# Patient Record
Sex: Female | Born: 1990 | Race: Black or African American | Hispanic: No | Marital: Single | Smoking: Never smoker
Health system: Southern US, Community
[De-identification: ages and names within clinical notes are randomized; demographics above are authoritative.]

## PROBLEM LIST (undated history)

## (undated) DIAGNOSIS — G43909 Migraine, unspecified, not intractable, without status migrainosus: Secondary | ICD-10-CM

## (undated) DIAGNOSIS — F419 Anxiety disorder, unspecified: Secondary | ICD-10-CM

## (undated) HISTORY — PX: NO PAST SURGERIES: SHX2092

---
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
## 2009-02-08 ENCOUNTER — Emergency Department (HOSPITAL_COMMUNITY): Admission: EM | Admit: 2009-02-08 | Discharge: 2009-02-08 | Payer: Self-pay | Admitting: Family Medicine

## 2009-02-12 ENCOUNTER — Emergency Department (HOSPITAL_COMMUNITY): Admission: EM | Admit: 2009-02-12 | Discharge: 2009-02-12 | Payer: Self-pay | Admitting: Family Medicine

## 2009-09-23 ENCOUNTER — Emergency Department (HOSPITAL_COMMUNITY): Admission: EM | Admit: 2009-09-23 | Discharge: 2009-09-23 | Payer: Self-pay | Admitting: Emergency Medicine

## 2009-10-25 ENCOUNTER — Emergency Department (HOSPITAL_COMMUNITY): Admission: EM | Admit: 2009-10-25 | Discharge: 2009-10-25 | Payer: Self-pay | Admitting: Emergency Medicine

## 2009-10-27 ENCOUNTER — Inpatient Hospital Stay (HOSPITAL_COMMUNITY): Admission: AD | Admit: 2009-10-27 | Discharge: 2009-10-27 | Payer: Self-pay | Admitting: Obstetrics and Gynecology

## 2009-10-27 ENCOUNTER — Emergency Department (HOSPITAL_COMMUNITY): Admission: EM | Admit: 2009-10-27 | Discharge: 2009-10-27 | Payer: Self-pay | Admitting: Family Medicine

## 2009-12-22 ENCOUNTER — Emergency Department (HOSPITAL_COMMUNITY): Admission: EM | Admit: 2009-12-22 | Discharge: 2009-12-22 | Payer: Self-pay | Admitting: Family Medicine

## 2010-01-12 ENCOUNTER — Emergency Department (HOSPITAL_COMMUNITY): Admission: EM | Admit: 2010-01-12 | Discharge: 2010-01-12 | Payer: Self-pay | Admitting: Emergency Medicine

## 2010-04-24 ENCOUNTER — Emergency Department (HOSPITAL_COMMUNITY): Admission: EM | Admit: 2010-04-24 | Discharge: 2010-04-24 | Payer: Self-pay | Admitting: Emergency Medicine

## 2010-04-26 ENCOUNTER — Emergency Department (HOSPITAL_COMMUNITY): Admission: EM | Admit: 2010-04-26 | Discharge: 2010-04-26 | Payer: Self-pay | Admitting: Family Medicine

## 2010-09-18 ENCOUNTER — Emergency Department (HOSPITAL_COMMUNITY): Admission: EM | Admit: 2010-09-18 | Discharge: 2010-09-18 | Payer: Self-pay | Admitting: Emergency Medicine

## 2010-10-18 ENCOUNTER — Emergency Department (HOSPITAL_COMMUNITY)
Admission: EM | Admit: 2010-10-18 | Discharge: 2010-10-18 | Payer: Self-pay | Source: Home / Self Care | Admitting: Emergency Medicine

## 2011-01-23 LAB — POCT URINALYSIS DIPSTICK
Glucose, UA: NEGATIVE mg/dL
Hgb urine dipstick: NEGATIVE
Ketones, ur: 15 mg/dL — AB
Nitrite: POSITIVE — AB
Protein, ur: 100 mg/dL — AB
Specific Gravity, Urine: 1.02 (ref 1.005–1.030)
Urobilinogen, UA: 1 mg/dL (ref 0.0–1.0)
pH: 6.5 (ref 5.0–8.0)

## 2011-01-23 LAB — URINE CULTURE
Colony Count: 9000
Culture  Setup Time: 201112062210

## 2011-01-23 LAB — POCT RAPID STREP A (OFFICE): Streptococcus, Group A Screen (Direct): POSITIVE — AB

## 2011-01-24 LAB — POCT I-STAT, CHEM 8
BUN: 6 mg/dL (ref 6–23)
Calcium, Ion: 1.13 mmol/L (ref 1.12–1.32)
Chloride: 108 mEq/L (ref 96–112)
Creatinine, Ser: 0.9 mg/dL (ref 0.4–1.2)
Glucose, Bld: 74 mg/dL (ref 70–99)
HCT: 43 % (ref 36.0–46.0)
Hemoglobin: 14.6 g/dL (ref 12.0–15.0)
Potassium: 4.2 mEq/L (ref 3.5–5.1)
Sodium: 143 mEq/L (ref 135–145)
TCO2: 25 mmol/L (ref 0–100)

## 2011-01-24 LAB — URINALYSIS, ROUTINE W REFLEX MICROSCOPIC
Bilirubin Urine: NEGATIVE
Glucose, UA: NEGATIVE mg/dL
Hgb urine dipstick: NEGATIVE
Ketones, ur: NEGATIVE mg/dL
Nitrite: NEGATIVE
Protein, ur: 100 mg/dL — AB
Specific Gravity, Urine: 1.025 (ref 1.005–1.030)
Urobilinogen, UA: 1 mg/dL (ref 0.0–1.0)
pH: 8 (ref 5.0–8.0)

## 2011-01-24 LAB — URINE MICROSCOPIC-ADD ON

## 2011-01-24 LAB — PREGNANCY, URINE: Preg Test, Ur: NEGATIVE

## 2011-01-24 LAB — GLUCOSE, CAPILLARY: Glucose-Capillary: 101 mg/dL — ABNORMAL HIGH (ref 70–99)

## 2011-02-01 ENCOUNTER — Emergency Department (HOSPITAL_COMMUNITY)
Admission: EM | Admit: 2011-02-01 | Discharge: 2011-02-01 | Disposition: A | Discharge status: Home / Self Care | Payer: Medicaid Other | Attending: Emergency Medicine | Admitting: Emergency Medicine

## 2011-02-01 DIAGNOSIS — M545 Low back pain, unspecified: Secondary | ICD-10-CM | POA: Insufficient documentation

## 2011-02-14 LAB — CBC
HCT: 37.9 % (ref 36.0–46.0)
MCV: 95.8 fL (ref 78.0–100.0)
Platelets: 218 10*3/uL (ref 150–400)
WBC: 6 10*3/uL (ref 4.0–10.5)

## 2011-02-14 LAB — POCT I-STAT, CHEM 8
BUN: 6 mg/dL (ref 6–23)
Calcium, Ion: 1.21 mmol/L (ref 1.12–1.32)
Chloride: 105 mEq/L (ref 96–112)
Glucose, Bld: 92 mg/dL (ref 70–99)

## 2011-02-14 LAB — GC/CHLAMYDIA PROBE AMP, GENITAL
Chlamydia, DNA Probe: NEGATIVE
GC Probe Amp, Genital: NEGATIVE

## 2011-02-22 LAB — HIV ANTIBODY (ROUTINE TESTING W REFLEX): HIV: NONREACTIVE

## 2011-03-28 NOTE — Discharge Summary (Signed)
Nicole Olson, [place]             ACCOUNT NO.:  [id number]   MEDICAL RECORD NO.:  [id number]          PATIENT TYPE:  INP   LOCATION:  9115                          FACILITY:  [facility]   PHYSICIAN:  James A. Ashley Royalty, M.D.DATE OF BIRTH:  08/02/1990   DATE OF ADMISSION:  06/28/2007  DATE OF DISCHARGE:  06/30/2007                               DISCHARGE SUMMARY   DISCHARGE DIAGNOSES:  1. Intrauterine pregnancy at 39 weeks 1 day gestation.  2. Rh negative.  3. Term birth of living child, vertex  4. Anemia - asymptomatic   OPERATIONS AND SPECIAL PROCEDURES:  OB delivery.   CONSULTATIONS:  None.   DISCHARGE MEDICATIONS:  Motrin 60 mg, Percocet, vitamins.   HISTORY AND PHYSICAL:  21 year old primigravida at 64 weeks 1 day  gestation.  Prenatal care was complicated by A negative blood type and  diminished abdominal circumference on ultrasound.  No frank IUGR was  noted.  The patient presented complaining of regular contractions.  Cervix on my examination was noted be 3+ cm dilated, 80% effaced, -1 to  -2 two station, vertex presentation.  For the remainder of the history  and physical, please see the chart.   HOSPITAL COURSE:  The patient was admitted to [facility].  Admission laboratory studies were drawn.  She was given  Pitocin.  She went on to labor and deliver on June 28, 2007.  The  infant was a 6 pound 3 ounce female with Apgars of 9 at 1 minute and 9 at  5 minutes.  The patient was sent to the newborn nursery.  Delivery was  accomplished by Dr. Sylvester Harder over an intact perineum.  There was a  first-degree laceration at the introitus which was easily closed.  The  patient's postpartum course was benign, save for the asymptomatic  anemia.  She was discharged on the second postpartum day afebrile and in  satisfactory condition.   DISPOSITION:  The patient is to return to [facility] and  Obstetrics in 6 weeks for postpartum evaluation.      James A. Ashley Royalty, M.D.  Electronically Signed     JAM/MEDQ  D:  08/01/2007  T:  08/01/2007  Job:  [id number]

## 2011-08-18 LAB — URINALYSIS, ROUTINE W REFLEX MICROSCOPIC
Bilirubin Urine: NEGATIVE
Glucose, UA: NEGATIVE mg/dL
Hgb urine dipstick: NEGATIVE
Ketones, ur: NEGATIVE mg/dL
Nitrite: NEGATIVE
Protein, ur: NEGATIVE mg/dL
Specific Gravity, Urine: 1.024 (ref 1.005–1.030)
Urobilinogen, UA: 1 mg/dL (ref 0.0–1.0)
pH: 6 (ref 5.0–8.0)

## 2011-08-18 LAB — URINE MICROSCOPIC-ADD ON

## 2011-08-18 LAB — URINE CULTURE: Colony Count: 15000

## 2011-08-18 LAB — PREGNANCY, URINE: Preg Test, Ur: NEGATIVE

## 2011-08-25 LAB — URINALYSIS, ROUTINE W REFLEX MICROSCOPIC
Glucose, UA: NEGATIVE
Ketones, ur: NEGATIVE
Nitrite: NEGATIVE
Protein, ur: NEGATIVE
pH: 6.5

## 2011-08-25 LAB — COMPREHENSIVE METABOLIC PANEL
ALT: 45 — ABNORMAL HIGH
AST: 47 — ABNORMAL HIGH
Albumin: 2.4 — ABNORMAL LOW
Alkaline Phosphatase: 94
BUN: 2 — ABNORMAL LOW
Chloride: 110
Potassium: 4.3
Sodium: 141
Total Bilirubin: 0.7
Total Protein: 5.9 — ABNORMAL LOW

## 2011-08-25 LAB — CBC
HCT: 28.9 — ABNORMAL LOW
HCT: 31.4 — ABNORMAL LOW
Hemoglobin: 11.1 — ABNORMAL LOW
Hemoglobin: 9.9 — ABNORMAL LOW
MCV: 97.1
Platelets: 134 — ABNORMAL LOW
Platelets: 205
RBC: 2.75 — ABNORMAL LOW
RBC: 2.98 — ABNORMAL LOW
RBC: 3.37 — ABNORMAL LOW
RDW: 13.6
WBC: 10.1 — ABNORMAL HIGH
WBC: 11.4 — ABNORMAL HIGH
WBC: 18.4 — ABNORMAL HIGH
WBC: 8.4

## 2011-08-25 LAB — RH IMMUNE GLOB WKUP(>/=20WKS)(NOT WOMEN'S HOSP)

## 2011-08-25 LAB — RPR: RPR Ser Ql: NONREACTIVE

## 2011-08-28 LAB — URINALYSIS, ROUTINE W REFLEX MICROSCOPIC
Bilirubin Urine: NEGATIVE
Glucose, UA: NEGATIVE
Ketones, ur: NEGATIVE
Leukocytes, UA: NEGATIVE
Nitrite: NEGATIVE
Protein, ur: NEGATIVE
Specific Gravity, Urine: 1.02
Urobilinogen, UA: 2 — ABNORMAL HIGH
pH: 8

## 2011-08-28 LAB — URINE MICROSCOPIC-ADD ON

## 2011-08-30 LAB — URINALYSIS, ROUTINE W REFLEX MICROSCOPIC
Glucose, UA: NEGATIVE
Ketones, ur: NEGATIVE
Protein, ur: NEGATIVE
Urobilinogen, UA: 1

## 2011-08-30 LAB — WET PREP, GENITAL: Trich, Wet Prep: NONE SEEN

## 2012-04-21 ENCOUNTER — Emergency Department (HOSPITAL_COMMUNITY)
Admission: EM | Admit: 2012-04-21 | Discharge: 2012-04-22 | Disposition: A | Discharge status: Home / Self Care | Payer: Medicaid Other | Attending: Emergency Medicine | Admitting: Emergency Medicine

## 2012-04-21 ENCOUNTER — Encounter (HOSPITAL_COMMUNITY): Payer: Self-pay | Admitting: *Deleted

## 2012-04-21 DIAGNOSIS — R51 Headache: Secondary | ICD-10-CM | POA: Insufficient documentation

## 2012-04-21 MED ORDER — METOCLOPRAMIDE HCL 5 MG/ML IJ SOLN
10.0000 mg | Freq: Once | INTRAMUSCULAR | Status: AC
Start: 2012-04-21 — End: 2012-04-21
  Administered 2012-04-21: 10 mg via INTRAVENOUS
  Filled 2012-04-21: qty 2

## 2012-04-21 MED ORDER — DIPHENHYDRAMINE HCL 50 MG/ML IJ SOLN
12.5000 mg | Freq: Once | INTRAMUSCULAR | Status: AC
  Administered 2012-04-21: 12.5 mg via INTRAVENOUS
  Filled 2012-04-21: qty 1

## 2012-04-21 MED ORDER — KETOROLAC TROMETHAMINE 30 MG/ML IJ SOLN
30.0000 mg | Freq: Once | INTRAMUSCULAR | Status: AC
  Administered 2012-04-21: 30 mg via INTRAVENOUS
  Filled 2012-04-21: qty 1

## 2012-04-21 MED ORDER — SODIUM CHLORIDE 0.9 % IV BOLUS (SEPSIS)
1000.0000 mL | Freq: Once | INTRAVENOUS | Status: AC
  Administered 2012-04-21: 1000 mL via INTRAVENOUS

## 2012-04-21 NOTE — ED Provider Notes (Signed)
History     CSN: [id number]  Arrival date & time 04/21/12  2143   First MD Initiated Contact with Patient 04/21/12 2253      Chief Complaint  Patient presents with  . Headache    (Consider location/radiation/quality/duration/timing/severity/associated sxs/prior treatment) HPI Comments: Patient has had a headache since yesterday, left frontal headache that has been persistent despite the fact that she has taken over-the-counter ibuprofen  Patient is a 21 y.o. female presenting with headaches. The history is provided by the patient.  Headache  This is a new problem. The current episode started 12 to 24 hours ago. The problem occurs constantly. The problem has not changed since onset.The headache is associated with bright light. The quality of the pain is described as throbbing. The pain is at a severity of 8/10. The pain is moderate. The pain does not radiate. Associated symptoms include nausea. Pertinent negatives include no fever and no vomiting.    History reviewed. No pertinent past medical history.  History reviewed. No pertinent past surgical history.  History reviewed. No pertinent family history.  History  Substance Use Topics  . Smoking status: Never Smoker   . Smokeless tobacco: Not on file  . Alcohol Use: Yes    OB History    Grav Para Term Preterm Abortions TAB SAB Ect Mult Living                  Review of Systems  Constitutional: Negative for fever and chills.  Gastrointestinal: Positive for nausea. Negative for vomiting.  Neurological: Positive for headaches. Negative for dizziness, weakness and numbness.    Allergies  Review of patient's allergies indicates no known allergies.  Home Medications   Current Outpatient Rx  Name Route Sig Dispense Refill  . IBUPROFEN 200 MG PO TABS Oral Take 400 mg by mouth once as needed. As needed for headache.      BP 120/84  Pulse 83  Temp(Src) 98.2 F (36.8 C) (Oral)  Resp 18  SpO2 98%  Physical Exam    Constitutional: She is oriented to person, place, and time. She appears well-developed and well-nourished.  HENT:  Head: Normocephalic.  Right Ear: External ear normal.  Left Ear: External ear normal.  Eyes: Pupils are equal, round, and reactive to light.  Neck: Normal range of motion.  Cardiovascular: Normal rate.   Musculoskeletal: Normal range of motion.  Neurological: She is alert and oriented to person, place, and time.  Skin: Skin is warm and dry.    ED Course  Procedures (including critical care time)  Labs Reviewed - No data to display No results found.   1. Headache       MDM   Headache that improved after IV fluid, Reglan, Toradol, and Benadryl.  Will discharge home        Arman Filter, NP 04/21/12 2320  Arman Filter, NP 04/21/12 2320

## 2012-04-21 NOTE — ED Notes (Signed)
patietn with history of headache and today her head started hurting this am.  Patient took advil but she was not able to hold it down.

## 2012-04-21 NOTE — ED Notes (Signed)
Here for frontal HA associated with photophobia, N/V.

## 2012-04-21 NOTE — Discharge Instructions (Signed)
General Headache, Without Cause A general headache has no specific cause. These headaches are not life-threatening. They will not lead to other types of headaches. HOME CARE   Make and keep follow-up visits with your doctor.   Only take medicine as told by your doctor.   Try to relax, get a massage, or use your thoughts to control your body (biofeedback).   Apply cold or heat to the head and neck. Apply 3 or 4 times a day or as needed.  Finding out the results of your test Ask when your test results will be ready. Make sure you get your test results. GET HELP RIGHT AWAY IF:   You have problems with medicine.   Your medicine does not help relieve pain.   Your headache changes or becomes worse.   You feel sick to your stomach (nauseous) or throw up (vomit).   You have a temperature by mouth above 102 F (38.9 C), not controlled by medicine.   Your have a stiff neck.   You have vision loss.   You have muscle weakness.   You lose control of your muscles.   You lose balance or have trouble walking.   You feel like you are going to pass out (faint).  MAKE SURE YOU:   Understand these instructions.   Will watch this condition.   Will get help right away if you are not doing well or get worse.  Document Released: 08/08/2008 Document Revised: 10/19/2011 Document Reviewed: 08/08/2008 Exit[employer] Patient Information 2012 [employer]. 

## 2012-04-22 NOTE — ED Notes (Signed)
Patient given discharge paperwork; went over discharge instructions with patient.  Patient instructed to follow up with primary care physician if symptoms persist, and to return to the ED for new, worsening, or concerning symptoms.

## 2012-04-23 ENCOUNTER — Emergency Department (INDEPENDENT_AMBULATORY_CARE_PROVIDER_SITE_OTHER)
Admission: EM | Admit: 2012-04-23 | Discharge: 2012-04-23 | Disposition: A | Discharge status: Home / Self Care | Payer: Self-pay | Source: Home / Self Care | Attending: Emergency Medicine | Admitting: Emergency Medicine

## 2012-04-23 ENCOUNTER — Encounter (HOSPITAL_COMMUNITY): Payer: Self-pay | Admitting: Emergency Medicine

## 2012-04-23 DIAGNOSIS — G43909 Migraine, unspecified, not intractable, without status migrainosus: Secondary | ICD-10-CM

## 2012-04-23 MED ORDER — DIPHENHYDRAMINE HCL 50 MG/ML IJ SOLN
INTRAMUSCULAR | Status: AC
  Filled 2012-04-23: qty 1

## 2012-04-23 MED ORDER — KETOROLAC TROMETHAMINE 60 MG/2ML IM SOLN
INTRAMUSCULAR | Status: AC
  Filled 2012-04-23: qty 2

## 2012-04-23 MED ORDER — KETOROLAC TROMETHAMINE 60 MG/2ML IM SOLN
60.0000 mg | Freq: Once | INTRAMUSCULAR | Status: AC
  Administered 2012-04-23: 60 mg via INTRAMUSCULAR

## 2012-04-23 MED ORDER — DIPHENHYDRAMINE HCL 50 MG/ML IJ SOLN
25.0000 mg | Freq: Once | INTRAMUSCULAR | Status: AC
  Administered 2012-04-23: 25 mg via INTRAMUSCULAR

## 2012-04-23 MED ORDER — PREDNISONE 5 MG PO KIT: 1.0000 | PACK | Freq: Every day | ORAL | Status: DC

## 2012-04-23 MED ORDER — DEXAMETHASONE SODIUM PHOSPHATE 10 MG/ML IJ SOLN
INTRAMUSCULAR | Status: AC
  Filled 2012-04-23: qty 2

## 2012-04-23 MED ORDER — DEXAMETHASONE SODIUM PHOSPHATE 10 MG/ML IJ SOLN
20.0000 mg | Freq: Once | INTRAMUSCULAR | Status: AC
  Administered 2012-04-23: 20 mg via INTRAMUSCULAR

## 2012-04-23 MED ORDER — ONDANSETRON 8 MG PO TBDP: 8.0000 mg | ORAL_TABLET | Freq: Three times a day (TID) | ORAL | Status: AC | PRN

## 2012-04-23 MED ORDER — SUMATRIPTAN SUCCINATE 50 MG PO TABS: 50.0000 mg | ORAL_TABLET | ORAL | Status: DC | PRN

## 2012-04-23 NOTE — ED Provider Notes (Signed)
Medical screening examination/treatment/procedure(s) were performed by non-physician practitioner and as supervising physician I was immediately available for consultation/collaboration.   Carleene Cooper III, MD 04/23/12 1655

## 2012-04-23 NOTE — ED Provider Notes (Signed)
Chief Complaint  Patient presents with  . Headache    History of Present Illness:   The patient has a two-week history of migraine type headache. This was possibly brought on by loud noise at work. The pain is located in the right hemicranium, is throbbing, and is rated 8/10 in intensity. It's associated with nausea and vomiting. She has photophobia and phonophobia and the pain is worse with activity and better with rest. She has a visual scotoma involving the right visual field. She denies any numbness, tingling, weakness, difficulty with speech or ambulation. She denies fevers, chills, sweats, sore throat, or stiff neck. The patient has a five-year history of migraines although these are infrequent. She had one that was similar to this or at the birth of her baby 5 years ago. She's had some minor headaches since then and this is the second severe migraine that she's had. She's been on Depakote Provera for contraception. She was at the emergency department 2 days ago for the same headache and was given IV Toradol, Benadryl, and Reglan. The headache went away for a little while but then came back again.  Review of Systems:  Other than noted above, the patient denies any of the following symptoms: Systemic:  No fever, chills, fatigue, photophobia, stiff neck. Eye:  No redness, eye pain, discharge, blurred vision, or diplopia. ENT:  No nasal congestion, rhinorrhea, sinus pressure or pain, sneezing, earache, or sore throat.  No jaw claudication. Neuro:  No paresthesias, loss of consciousness, seizure activity, muscle weakness, trouble with coordination or gait, trouble speaking or swallowing. Psych:  No depression, anxiety or trouble sleeping.  PMFSH:  Past medical history, family history, social history, meds, and allergies were reviewed.  Physical Exam:   Vital signs:  BP 107/67  Pulse 52  Temp(Src) 98.3 F (36.8 C) (Oral)  Resp 16  SpO2 100% General:  Alert and oriented. She appears  uncomfortable and is lying in a darkened room she'll rise from a little bit of light that was coming from the computer screen. Eye:  Lids and conjunctivas normal.  PERRL,  Full EOMs.  Fundi benign with normal discs and vessels. ENT:  No cranial or facial tenderness to palpation.  TMs and canals clear.  Nasal mucosa was normal and uncongested without any drainage. No intra oral lesions, pharynx clear, mucous membranes moist, dentition normal. Neck:  Supple, full ROM, no tenderness to palpation.  No adenopathy or mass. Neuro:  Alert and orented times 3.  Speech was clear, fluent, and appropriate.  Cranial nerves intact. No pronator drift, muscle strength normal. Finger to nose normal.  DTRs were 2+ and symmetrical.Station and gait were normal.  Romberg's sign was normal.  Able to perform tandem gait well. Psych:  Normal affect.  Medications given in [facility]:  She was given Toradol 60 mg IM, Decadron 10 mg IM, and Benadryl 25 mg IM. She tolerated these all well without any immediate side effects.  Assessment:  The encounter diagnosis was Migraine headache. With status migrainosus.  Plan:   1.  The following meds were prescribed:   New Prescriptions   ONDANSETRON (ZOFRAN ODT) 8 MG DISINTEGRATING TABLET    Take 1 tablet (8 mg total) by mouth every 8 (eight) hours as needed for nausea.   PREDNISONE 5 MG KIT    Take 1 kit (5 mg total) by mouth daily after breakfast. Prednisone 5 mg 6 day dosepack.  Take as directed.   SUMATRIPTAN (IMITREX) 50 MG TABLET  Take 1 tablet (50 mg total) by mouth every 2 (two) hours as needed for migraine.   2.  The patient was instructed in symptomatic care and handouts were given. 3.  The patient was told to return if becoming worse in any way, if no better in 3 or 4 days, and given some red flag symptoms that would indicate earlier return. She was urged to followup with her primary care physician as soon as possible for ongoing migraine treatment.    Reuben Likes,  MD 04/23/12 [phone]

## 2012-04-23 NOTE — Discharge Instructions (Signed)

## 2012-04-23 NOTE — ED Notes (Signed)
C/o HA for past 2-3 days, no improvement w her medications

## 2012-05-18 ENCOUNTER — Encounter (HOSPITAL_COMMUNITY): Payer: Self-pay | Admitting: *Deleted

## 2012-05-18 ENCOUNTER — Emergency Department (HOSPITAL_COMMUNITY)
Admission: EM | Admit: 2012-05-18 | Discharge: 2012-05-18 | Disposition: A | Discharge status: Home / Self Care | Payer: Medicaid Other | Attending: Emergency Medicine | Admitting: Emergency Medicine

## 2012-05-18 DIAGNOSIS — H1012 Acute atopic conjunctivitis, left eye: Secondary | ICD-10-CM

## 2012-05-18 DIAGNOSIS — H1045 Other chronic allergic conjunctivitis: Secondary | ICD-10-CM | POA: Insufficient documentation

## 2012-05-18 NOTE — ED Notes (Signed)
Pt states she began with itching of her left eye. It became red and swollen. Pain is 1/10.  Pt used visene eye drops but it did not help. No fever, no n/v/d. No pain meds taken

## 2012-05-18 NOTE — ED Provider Notes (Signed)
History     CSN: [id number]  Arrival date & time 05/18/12  0040   First MD Initiated Contact with Patient 05/18/12 0127      Chief Complaint  Patient presents with  . Conjunctivitis    (Consider location/radiation/quality/duration/timing/severity/associated sxs/prior treatment) Patient is a 21 y.o. female presenting with conjunctivitis. The history is provided by the patient.  Conjunctivitis  The current episode started today. The problem occurs rarely. The problem has been unchanged. The problem is mild. Associated symptoms include eye itching, eye discharge and eye redness. Pertinent negatives include no fever, no decreased vision, no double vision, no photophobia, no abdominal pain, no diarrhea, no headaches, no rhinorrhea, no muscle aches, no neck pain, no neck stiffness, no cough, no URI, no rash and no eye pain. There is pain in the left eye. There were no sick contacts. She has received no recent medical care.    History reviewed. No pertinent past medical history.  History reviewed. No pertinent past surgical history.  History reviewed. No pertinent family history.  History  Substance Use Topics  . Smoking status: Never Smoker   . Smokeless tobacco: Not on file  . Alcohol Use: Yes    OB History    Grav Para Term Preterm Abortions TAB SAB Ect Mult Living                  Review of Systems  Constitutional: Negative for fever.  HENT: Negative for rhinorrhea and neck pain.   Eyes: Positive for discharge, redness and itching. Negative for double vision, photophobia and pain.  Respiratory: Negative for cough.   Gastrointestinal: Negative for abdominal pain and diarrhea.  Skin: Negative for rash.  Neurological: Negative for headaches.  All other systems reviewed and are negative.    Allergies  Review of patient's allergies indicates no known allergies.  Home Medications   Current Outpatient Rx  Name Route Sig Dispense Refill  . IBUPROFEN 200 MG PO TABS Oral  Take 400 mg by mouth once as needed. As needed for headache.    . SUMATRIPTAN SUCCINATE 50 MG PO TABS Oral Take 1 tablet (50 mg total) by mouth every 2 (two) hours as needed for migraine. 10 tablet 0    BP 106/69  Pulse 76  Temp 98.4 F (36.9 C) (Oral)  Resp 17  SpO2 100%  Physical Exam  Constitutional: She appears well-developed and well-nourished.  Eyes: Pupils are equal, round, and reactive to light. Right eye exhibits no hordeolum. Left eye exhibits chemosis and discharge. Left eye exhibits no exudate and no hordeolum. Right conjunctiva is not injected. Left conjunctiva is injected. Left conjunctiva has no hemorrhage. No scleral icterus. Right eye exhibits normal extraocular motion.  Cardiovascular: Normal rate.     ED Course  Procedures (including critical care time)  Labs Reviewed - No data to display No results found.   1. Allergic conjunctivitis of left eye       MDM  Consistent with seasonal allergies and at this time no concerns of conjunctivitis or periorbital cellulitis.          Edwardine Deschepper C. Brecken Walth, DO 05/18/12 0240

## 2012-09-15 ENCOUNTER — Emergency Department (HOSPITAL_COMMUNITY)
Admission: EM | Admit: 2012-09-15 | Discharge: 2012-09-15 | Disposition: A | Discharge status: Home / Self Care | Payer: Medicaid Other | Attending: Emergency Medicine | Admitting: Emergency Medicine

## 2012-09-15 ENCOUNTER — Encounter (HOSPITAL_COMMUNITY): Payer: Self-pay | Admitting: Emergency Medicine

## 2012-09-15 DIAGNOSIS — R110 Nausea: Secondary | ICD-10-CM | POA: Insufficient documentation

## 2012-09-15 DIAGNOSIS — G43909 Migraine, unspecified, not intractable, without status migrainosus: Secondary | ICD-10-CM | POA: Insufficient documentation

## 2012-09-15 HISTORY — DX: Migraine, unspecified, not intractable, without status migrainosus: G43.909

## 2012-09-15 MED ORDER — SODIUM CHLORIDE 0.9 % IV BOLUS (SEPSIS)
1000.0000 mL | Freq: Once | INTRAVENOUS | Status: AC
  Administered 2012-09-15: 1000 mL via INTRAVENOUS

## 2012-09-15 MED ORDER — IBUPROFEN 600 MG PO TABS: 600.0000 mg | ORAL_TABLET | Freq: Four times a day (QID) | ORAL | Status: DC | PRN

## 2012-09-15 MED ORDER — KETOROLAC TROMETHAMINE 30 MG/ML IJ SOLN
30.0000 mg | Freq: Once | INTRAMUSCULAR | Status: AC
  Administered 2012-09-15: 30 mg via INTRAVENOUS
  Filled 2012-09-15: qty 1

## 2012-09-15 MED ORDER — METOCLOPRAMIDE HCL 5 MG/ML IJ SOLN
10.0000 mg | Freq: Once | INTRAMUSCULAR | Status: AC
  Administered 2012-09-15: 10 mg via INTRAVENOUS
  Filled 2012-09-15: qty 2

## 2012-09-15 NOTE — ED Notes (Signed)
Pt A.O. X 4. NAD.  Respirations even and regular. Skin warm, dry, and intact. Vitals stable. Gait stable with no assist. Reports migraine 1/10. Denies N/V. Verbalized understanding of medication admin. Able to locate contact information for follow up with neurologist. No further questions at this time.

## 2012-09-15 NOTE — ED Notes (Signed)
Pt reports having migraine since earlier today, took tylenol around 4pm; reports light sensitivity, n/v, and some blurred vision; pt reports hx of migraines

## 2012-09-15 NOTE — ED Provider Notes (Addendum)
History     CSN: [id number]  Arrival date & time 09/15/12  2105   First MD Initiated Contact with Patient 09/15/12 2123      Chief Complaint  Patient presents with  . Migraine    (Consider location/radiation/quality/duration/timing/severity/associated sxs/prior treatment) HPI Comments: Pt comes in with cc of migraines. Pt has been told in the past she has migraines, and she comes to the ED with similar headaches in the past. The headache is throbbing, left sided, and there is associated nausea, photophobia. No meds taken at home. No visual complains, seizures, altered mental status, loss of consciousness, new weakness, or numbness, no gait instability.  Patient is a 21 y.o. female presenting with migraines. The history is provided by the patient.  Migraine Associated symptoms include headaches. Pertinent negatives include no chest pain, no abdominal pain and no shortness of breath.    Past Medical History  Diagnosis Date  . Migraine     History reviewed. No pertinent past surgical history.  History reviewed. No pertinent family history.  History  Substance Use Topics  . Smoking status: Never Smoker   . Smokeless tobacco: Not on file  . Alcohol Use: No    OB History    Grav Para Term Preterm Abortions TAB SAB Ect Mult Living                  Review of Systems  Constitutional: Negative for activity change.  HENT: Negative for facial swelling and neck pain.   Respiratory: Negative for cough, shortness of breath and wheezing.   Cardiovascular: Negative for chest pain.  Gastrointestinal: Positive for nausea. Negative for vomiting, abdominal pain, diarrhea, constipation, blood in stool and abdominal distention.  Genitourinary: Negative for hematuria and difficulty urinating.  Skin: Negative for color change.  Neurological: Positive for headaches. Negative for speech difficulty.  Hematological: Does not bruise/bleed easily.  Psychiatric/Behavioral: Negative for  confusion.    Allergies  Review of patient's allergies indicates no known allergies.  Home Medications  No current outpatient prescriptions on file.  BP 114/69  Pulse 73  Temp 98.8 F (37.1 C) (Oral)  Resp 26  SpO2 100%  Physical Exam  Nursing note and vitals reviewed. Constitutional: She is oriented to person, place, and time. She appears well-developed.  HENT:  Head: Normocephalic and atraumatic.  Eyes: Conjunctivae normal and EOM are normal. Pupils are equal, round, and reactive to light.  Neck: Normal range of motion. Neck supple.  Cardiovascular: Normal rate, regular rhythm and normal heart sounds.   Pulmonary/Chest: Effort normal and breath sounds normal. No respiratory distress.  Abdominal: Soft. Bowel sounds are normal. She exhibits no distension. There is no tenderness. There is no rebound and no guarding.  Neurological: She is alert and oriented to person, place, and time. No cranial nerve deficit. Coordination normal.       Cerebellar exam is normal (finger to nose) Sensory exam normal for bilateral upper and lower extremities - and patient is able to discriminate between sharp and dull. Motor exam is 4+/5  Skin: Skin is warm and dry.    ED Course  Procedures (including critical care time)   Labs Reviewed  PREGNANCY, URINE   No results found.   No diagnosis found.    MDM  DDX includes: Primary headaches - including migrainous headaches, cluster headaches, tension headaches. ICH Carotid dissection Cavernous sinus thrombosis Meningitis Encephalitis Sinusitis Tumor Vascular headaches AV malformation Brain aneurysm Muscular headaches  A/P: Pt comes in with cc of headaches. No  concerns for life threatening secondary headaches because she has had similar headaches in the  past and it is not the worst headache she has had. Neuro exam is WNL. We will try to abort the headache with some nsaid and reglan.  Derwood Kaplan, MD 09/15/12 1610  Derwood Kaplan, MD 09/15/12 2320 Pt is now headache free. Will discharge.  Derwood Kaplan, MD 09/15/12 2325

## 2012-09-19 ENCOUNTER — Encounter (HOSPITAL_COMMUNITY): Payer: Self-pay | Admitting: *Deleted

## 2012-09-19 ENCOUNTER — Emergency Department (HOSPITAL_COMMUNITY)
Admission: EM | Admit: 2012-09-19 | Discharge: 2012-09-20 | Disposition: A | Discharge status: Home / Self Care | Payer: Medicaid Other | Attending: Emergency Medicine | Admitting: Emergency Medicine

## 2012-09-19 DIAGNOSIS — B009 Herpesviral infection, unspecified: Secondary | ICD-10-CM | POA: Insufficient documentation

## 2012-09-19 DIAGNOSIS — Z3202 Encounter for pregnancy test, result negative: Secondary | ICD-10-CM | POA: Insufficient documentation

## 2012-09-19 DIAGNOSIS — B001 Herpesviral vesicular dermatitis: Secondary | ICD-10-CM

## 2012-09-19 DIAGNOSIS — Z8669 Personal history of other diseases of the nervous system and sense organs: Secondary | ICD-10-CM | POA: Insufficient documentation

## 2012-09-19 DIAGNOSIS — R1012 Left upper quadrant pain: Secondary | ICD-10-CM | POA: Insufficient documentation

## 2012-09-19 LAB — COMPREHENSIVE METABOLIC PANEL
Albumin: 3.9 g/dL (ref 3.5–5.2)
Alkaline Phosphatase: 56 U/L (ref 39–117)
BUN: 8 mg/dL (ref 6–23)
CO2: 26 mEq/L (ref 19–32)
Chloride: 105 mEq/L (ref 96–112)
Creatinine, Ser: 0.73 mg/dL (ref 0.50–1.10)
GFR calc non Af Amer: >90 mL/min (ref >90)
Potassium: 3.6 mEq/L (ref 3.5–5.1)
Total Bilirubin: 0.6 mg/dL (ref 0.3–1.2)

## 2012-09-19 LAB — CBC WITH DIFFERENTIAL/PLATELET
Basophils Relative: 1 % (ref 0–1)
HCT: 39.9 % (ref 36.0–46.0)
Hemoglobin: 13.1 g/dL (ref 12.0–15.0)
Lymphocytes Relative: 53 % — ABNORMAL HIGH (ref 12–46)
Lymphs Abs: 3 10*3/uL (ref 0.7–4.0)
MCHC: 32.8 g/dL (ref 30.0–36.0)
Monocytes Absolute: 0.4 10*3/uL (ref 0.1–1.0)
Monocytes Relative: 6 % (ref 3–12)
Neutro Abs: 1.9 10*3/uL (ref 1.7–7.7)
Neutrophils Relative %: 34 % — ABNORMAL LOW (ref 43–77)
RBC: 4.28 MIL/uL (ref 3.87–5.11)
WBC: 5.6 10*3/uL (ref 4.0–10.5)

## 2012-09-19 LAB — URINALYSIS, MICROSCOPIC ONLY
Bilirubin Urine: NEGATIVE
Glucose, UA: NEGATIVE mg/dL
Ketones, ur: 15 mg/dL — AB
Protein, ur: NEGATIVE mg/dL
pH: 7.5 (ref 5.0–8.0)

## 2012-09-19 LAB — GLUCOSE, CAPILLARY: Glucose-Capillary: 86 mg/dL (ref 70–99)

## 2012-09-19 LAB — POCT PREGNANCY, URINE: Preg Test, Ur: NEGATIVE

## 2012-09-19 LAB — LIPASE, BLOOD: Lipase: 30 U/L (ref 11–59)

## 2012-09-19 NOTE — ED Notes (Signed)
Pt c/o upper abominal pain, denies n/v/d, fevers.  Also wants to be seen for cold sores, states Abreva usually helps but no relief this time.

## 2012-09-19 NOTE — ED Provider Notes (Signed)
History     CSN: [id number]  Arrival date & time 09/19/12  2102   First MD Initiated Contact with Patient 09/19/12 2318      Chief Complaint  Patient presents with  . Abdominal Pain  . Mouth Lesions    (Consider location/radiation/quality/duration/timing/severity/associated sxs/prior treatment) HPI  Nicole Olson is a 21 y.o. female c/o LUQ abdominal pain x1 week worsening over the course of 2 days. Pain is rated at 8/10, not alleviated by naproxen. Denies fever, N/V, change in bowel or bladder habits, vaginal discharge, or rash. Pt is also having an outbreak of cold sores.   Past Medical History  Diagnosis Date  . Migraine     History reviewed. No pertinent past surgical history.  History reviewed. No pertinent family history.  History  Substance Use Topics  . Smoking status: Never Smoker   . Smokeless tobacco: Not on file  . Alcohol Use: No    OB History    Grav Para Term Preterm Abortions TAB SAB Ect Mult Living                  Review of Systems  Constitutional: Negative for fever.  Respiratory: Negative for shortness of breath.   Cardiovascular: Negative for chest pain.  Gastrointestinal: Positive for abdominal pain. Negative for nausea, vomiting and diarrhea.  Skin: Positive for rash.  All other systems reviewed and are negative.    Allergies  Review of patient's allergies indicates no known allergies.  Home Medications   Current Outpatient Rx  Name  Route  Sig  Dispense  Refill  . IBUPROFEN 600 MG PO TABS   Oral   Take 1 tablet (600 mg total) by mouth every 6 (six) hours as needed for pain.   30 tablet   0     BP 118/52  Pulse 81  Temp 98.7 F (37.1 C) (Oral)  Resp 18  SpO2 98%  Physical Exam  Nursing note and vitals reviewed. Constitutional: She is oriented to person, place, and time. She appears well-developed and well-nourished. No distress.  HENT:  Head: Normocephalic and atraumatic.  Mouth/Throat: Oropharynx is clear and  moist.  Eyes: Conjunctivae normal and EOM are normal. Pupils are equal, round, and reactive to light.  Neck: Normal range of motion.  Cardiovascular: Normal rate, regular rhythm and intact distal pulses.   Pulmonary/Chest: Effort normal and breath sounds normal. No stridor. No respiratory distress. She has no wheezes. She has no rales. She exhibits no tenderness.  Abdominal: Soft. Bowel sounds are normal. She exhibits no distension and no mass. There is tenderness. There is no rebound and no guarding.       Mild TTP of LUQ, no rebound or gaurding  Musculoskeletal: Normal range of motion.  Neurological: She is alert and oriented to person, place, and time.  Skin:       Cold sore to left and right upper lip   Psychiatric: She has a normal mood and affect.    ED Course  Procedures (including critical care time)  Labs Reviewed  CBC WITH DIFFERENTIAL - Abnormal; Notable for the following:    Neutrophils Relative 34 (*)     Lymphocytes Relative 53 (*)     Eosinophils Relative 6 (*)     All other components within normal limits  COMPREHENSIVE METABOLIC PANEL - Abnormal; Notable for the following:    Glucose, Bld 56 (*)     All other components within normal limits  URINALYSIS, MICROSCOPIC ONLY - Abnormal; Notable  for the following:    APPearance CLOUDY (*)     Ketones, ur 15 (*)     Leukocytes, UA TRACE (*)     Squamous Epithelial / LPF MANY (*)     All other components within normal limits  LIPASE, BLOOD  POCT PREGNANCY, URINE  GLUCOSE, CAPILLARY   No results found.   1. LUQ abdominal pain   2. Herpes labialis       MDM  Abdominal exam is benign and due to lack of associated symptoms and length of pain I doubt any acute process.     Pt verbalized understanding and agrees with care plan. Outpatient follow-up and return precautions given.    New Prescriptions   TRAMADOL (ULTRAM) 50 MG TABLET    Take 1 tablet (50 mg total) by mouth every 6 (six) hours as needed for pain.     VALACYCLOVIR (VALTREX) 1000 MG TABLET    Take 2 tablets (2,000 mg total) by mouth once.         Wynetta Emery, PA-C 09/20/12 [phone]

## 2012-09-20 MED ORDER — VALACYCLOVIR HCL 1 G PO TABS: 2000.0000 mg | ORAL_TABLET | Freq: Once | ORAL | Status: DC

## 2012-09-20 MED ORDER — ACETAMINOPHEN 325 MG PO TABS
975.0000 mg | ORAL_TABLET | Freq: Once | ORAL | Status: AC
  Administered 2012-09-20: 975 mg via ORAL
  Filled 2012-09-20: qty 3

## 2012-09-20 MED ORDER — TRAMADOL HCL 50 MG PO TABS: 50.0000 mg | ORAL_TABLET | Freq: Four times a day (QID) | ORAL | Status: DC | PRN

## 2012-09-20 NOTE — ED Notes (Signed)
Requested to speak with Charge nurse before discharge, charge nurse informed.

## 2012-09-21 NOTE — ED Provider Notes (Signed)
Medical screening examination/treatment/procedure(s) were performed by non-physician practitioner and as supervising physician I was immediately available for consultation/collaboration.    Sarath Privott R Elizah Mierzwa, MD 09/21/12 1557 

## 2012-10-29 ENCOUNTER — Encounter (HOSPITAL_COMMUNITY): Payer: Self-pay | Admitting: Emergency Medicine

## 2012-10-29 ENCOUNTER — Emergency Department (HOSPITAL_COMMUNITY)
Admission: EM | Admit: 2012-10-29 | Discharge: 2012-10-29 | Disposition: A | Discharge status: Home / Self Care | Payer: Medicaid Other | Source: Home / Self Care | Attending: Emergency Medicine | Admitting: Emergency Medicine

## 2012-10-29 DIAGNOSIS — B001 Herpesviral vesicular dermatitis: Secondary | ICD-10-CM

## 2012-10-29 DIAGNOSIS — B009 Herpesviral infection, unspecified: Secondary | ICD-10-CM

## 2012-10-29 MED ORDER — VALACYCLOVIR HCL 1 G PO TABS: ORAL_TABLET | ORAL | Status: DC

## 2012-10-29 NOTE — ED Provider Notes (Signed)
No chief complaint on file.   History of Present Illness:   Nicole Olson is a 21 year old female who has had a one-day history of fever blisters at the right corner of her mouth. These are painful and irritated feeling. She's had this multiple times before, the most recent episode being about a month ago. She's gotten good results with a valacyclovir in a single dose of 2000 mg at one time. She denies any intraoral lesions or sore throat. She's had no fever, chills, headache, nasal congestion, or rhinorrhea. She denies any adenopathy, or stiff neck. She has had a slight cough.  Review of Systems:  Other than noted above, the patient denies any of the following symptoms: Systemic:  No fevers, chills, sweats, weight loss or gain, fatigue, or tiredness. Eye:  No redness, pain, discharge, itching, blurred vision, or diplopia. ENT:  No headache, nasal congestion, sneezing, itching, epistaxis, ear pain, congestion, decreased hearing, ringing in ears, vertigo, or tinnitus.  No oral lesions, sore throat, pain on swallowing, or hoarseness. Neck:  No mass, tenderness or adenopathy. Lungs:  No coughing, wheezing, or shortness of breath. Skin:  No rash or itching.  PMFSH:  Past medical history, family history, social history, meds, and allergies were reviewed.  Physical Exam:   Vital signs:  BP 107/69  Pulse 76  Temp 99.9 F (37.7 C) (Oral)  Resp 18  SpO2 100% General:  Alert and oriented.  In no distress.  Skin warm and dry. Eye:  PERRL, full EOMs, lids and conjunctiva normal.   ENT:  TMs and canals clear.  Nasal mucosa not congested and without drainage.  Mucous membranes moist, no oral lesions, normal dentition, pharynx clear.  No cranial or facial pain to palplation. She has a cluster of small vesicular lesions at the right corner of the mouth. Neck:  Supple, full ROM.  No adenopathy, tenderness or mass.  Thyroid normal. Lungs:  Breath sounds clear and equal bilaterally.  No wheezes, rales or  rhonchi. Heart:  Rhythm regular, without extrasystoles.  No gallops or murmers. Skin:  Clear, warm and dry.   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  Plan:   1.  The following meds were prescribed:   New Prescriptions   VALACYCLOVIR (VALTREX) 1000 MG TABLET    2 tabs at one time   She was given 12 refills on this since this has been a recurring problem.  2.  The patient was instructed in symptomatic care and handouts were given. 3.  The patient was told to return if becoming worse in any way, if no better in 3 or 4 days, and given some red flag symptoms that would indicate earlier return.     Reuben Likes, MD 10/29/12 [phone]

## 2012-10-29 NOTE — ED Notes (Signed)
C/o 3-4 small bumps to R corner of her mouth onset today.  No pain now.  States it was cultured last time but was not told it was Herpes.

## 2012-11-05 ENCOUNTER — Encounter (HOSPITAL_COMMUNITY): Payer: Self-pay | Admitting: Emergency Medicine

## 2012-11-05 ENCOUNTER — Emergency Department (HOSPITAL_COMMUNITY)
Admission: EM | Admit: 2012-11-05 | Discharge: 2012-11-05 | Disposition: A | Discharge status: Home / Self Care | Payer: Medicaid Other | Source: Home / Self Care

## 2012-11-05 DIAGNOSIS — L0390 Cellulitis, unspecified: Secondary | ICD-10-CM

## 2012-11-05 DIAGNOSIS — L0291 Cutaneous abscess, unspecified: Secondary | ICD-10-CM

## 2012-11-05 DIAGNOSIS — J029 Acute pharyngitis, unspecified: Secondary | ICD-10-CM

## 2012-11-05 DIAGNOSIS — J069 Acute upper respiratory infection, unspecified: Secondary | ICD-10-CM

## 2012-11-05 MED ORDER — SULFAMETHOXAZOLE-TRIMETHOPRIM 800-160 MG PO TABS: 1.0000 | ORAL_TABLET | Freq: Two times a day (BID) | ORAL | Status: DC

## 2012-11-05 NOTE — ED Provider Notes (Signed)
History     CSN: [id number]  Arrival date & time 11/05/12  1423   None     Chief Complaint  Patient presents with  . Sore Throat    (Consider location/radiation/quality/duration/timing/severity/associated sxs/prior treatment) HPI Comments: 21 year old female with sore throat dry throat for 2 days. She has had some PND associated with laryngitis. Her chief complaint is dryness to the throat she has been drinking a lot of water. She denies fever or chills.  Second complaint is that of "boils" . She describes today raised cystic structure in the left axilla that ruptured exuded a purulent material and then subsided. She still has a small pea-like structure in the axilla. It is no longer painful. She also has a tender red raised area to the left buttock. Mildly erythematous with minor induration. Neither of these lesions are not amenable to I and D. at this time.   Past Medical History  Diagnosis Date  . Migraine     History reviewed. No pertinent past surgical history.  Family History  Problem Relation Age of Onset  . Breast cancer Mother     History  Substance Use Topics  . Smoking status: Never Smoker   . Smokeless tobacco: Not on file  . Alcohol Use: No    OB History    Grav Para Term Preterm Abortions TAB SAB Ect Mult Living                  Review of Systems  Constitutional: Negative for fever, chills, activity change, appetite change and fatigue.  HENT: Positive for congestion, sore throat, rhinorrhea and postnasal drip. Negative for facial swelling, neck pain and neck stiffness.   Eyes: Negative.   Respiratory: Negative.   Cardiovascular: Negative.   Gastrointestinal: Negative.   Genitourinary: Negative.   Skin: Negative for pallor and rash.       See history of present illness  Neurological: Negative.     Allergies  Review of patient's allergies indicates no known allergies.  Home Medications   Current Outpatient Rx  Name  Route  Sig  Dispense   Refill  . IBUPROFEN 600 MG PO TABS   Oral   Take 1 tablet (600 mg total) by mouth every 6 (six) hours as needed for pain.   30 tablet   0   . MEDROXYPROGESTERONE ACETATE 150 MG/ML IM SUSP   Intramuscular   Inject 150 mg into the muscle every 3 (three) months.         . SULFAMETHOXAZOLE-TRIMETHOPRIM 800-160 MG PO TABS   Oral   Take 1 tablet by mouth 2 (two) times daily. X 5 days   10 tablet   0   . TRAMADOL HCL 50 MG PO TABS   Oral   Take 1 tablet (50 mg total) by mouth every 6 (six) hours as needed for pain.   15 tablet   0   . VALACYCLOVIR HCL 1 G PO TABS   Oral   Take 2 tablets (2,000 mg total) by mouth once.   4 tablet   0   . VALACYCLOVIR HCL 1 G PO TABS      2 tabs at one time   2 tablet   12     BP 112/68  Pulse 86  Temp 98.2 F (36.8 C) (Oral)  Resp 18  SpO2 100%  Physical Exam  Nursing note and vitals reviewed. Constitutional: She is oriented to person, place, and time. She appears well-developed and well-nourished. No distress.  HENT:       Bilateral TMs are normal Oropharynx with erythema and cobblestoning appearance; clear PND  Neck: Normal range of motion. Neck supple.  Cardiovascular: Normal rate and regular rhythm.   Pulmonary/Chest: Effort normal and breath sounds normal. No respiratory distress. She has no wheezes.  Musculoskeletal: Normal range of motion. She exhibits no edema.  Lymphadenopathy:    She has no cervical adenopathy.  Neurological: She is alert and oriented to person, place, and time.  Skin: Skin is warm and dry. No rash noted.  Psychiatric: She has a normal mood and affect.    ED Course  Procedures (including critical care time)   Labs Reviewed  POCT RAPID STREP A ([facility] ONLY)   No results found.   1. URI (upper respiratory infection)   2. Pharyngitis   3. Cellulitis and abscess       MDM  To the areas of early cellulitis he is treated with Septra DS 1 twice a day for 5 days for prophylaxis. She has  a URI with minor symptoms. She does not appear he will and she is afebrile. She may use Cepacol lozenges for her throat and by Biotene spray for her mouth and throat. She may continue to use Aleve or ibuprofen for sore throat pain. Encourage her to use saline nose spray as often as possible.         Hayden Rasmussen, NP 11/05/12 1531

## 2012-11-05 NOTE — ED Notes (Signed)
Pt c/o sore throat w/onset of 2 days... Sx include: dysphagia, dry throat, runny nose, nasal congestion... Denies: fevers, vomiting, nauseas, diarrhea... She is alert w/no signs of acute distress.

## 2012-11-11 NOTE — ED Provider Notes (Signed)
Medical screening examination/treatment/procedure(s) were performed by resident physician or non-physician practitioner and as supervising physician I was immediately available for consultation/collaboration.   Chung Chagoya DOUGLAS MD.    Shayde Gervacio D Alcario Tinkey, MD 11/11/12 2048 

## 2013-05-30 ENCOUNTER — Other Ambulatory Visit: Payer: Self-pay | Admitting: Family Medicine

## 2013-05-30 DIAGNOSIS — N63 Unspecified lump in unspecified breast: Secondary | ICD-10-CM

## 2013-05-30 DIAGNOSIS — Z803 Family history of malignant neoplasm of breast: Secondary | ICD-10-CM

## 2013-06-04 ENCOUNTER — Ambulatory Visit
Admission: RE | Admit: 2013-06-04 | Discharge: 2013-06-04 | Disposition: A | Discharge status: Home / Self Care | Payer: Medicaid Other | Source: Ambulatory Visit | Attending: Family Medicine | Admitting: Family Medicine

## 2013-06-04 DIAGNOSIS — Z803 Family history of malignant neoplasm of breast: Secondary | ICD-10-CM

## 2013-06-04 DIAGNOSIS — N63 Unspecified lump in unspecified breast: Secondary | ICD-10-CM

## 2013-07-16 ENCOUNTER — Emergency Department (HOSPITAL_COMMUNITY)
Admission: EM | Admit: 2013-07-16 | Discharge: 2013-07-16 | Disposition: A | Discharge status: Home / Self Care | Payer: Medicaid Other | Source: Home / Self Care

## 2013-07-16 ENCOUNTER — Encounter (HOSPITAL_COMMUNITY): Payer: Self-pay

## 2013-07-16 DIAGNOSIS — A0811 Acute gastroenteropathy due to Norwalk agent: Secondary | ICD-10-CM

## 2013-07-16 MED ORDER — ONDANSETRON 4 MG PO TBDP
ORAL_TABLET | ORAL | Status: AC
  Filled 2013-07-16: qty 2

## 2013-07-16 MED ORDER — ONDANSETRON 4 MG PO TBDP: 8.0000 mg | ORAL_TABLET | Freq: Once | ORAL | Status: DC

## 2013-07-16 MED ORDER — ONDANSETRON 4 MG PO TBDP
8.0000 mg | ORAL_TABLET | Freq: Once | ORAL | Status: AC
  Administered 2013-07-16: 8 mg via ORAL

## 2013-07-16 MED ORDER — ONDANSETRON HCL 4 MG PO TABS: 4.0000 mg | ORAL_TABLET | Freq: Four times a day (QID) | ORAL | Status: DC

## 2013-07-16 NOTE — ED Provider Notes (Signed)
CSN: [id number]     Arrival date & time 07/16/13  1732 History   None    Chief Complaint  Patient presents with  . GI Problem   (Consider location/radiation/quality/duration/timing/severity/associated sxs/prior Treatment) Patient is a 22 y.o. female presenting with GI illness. The history is provided by the patient.  GI Problem This is a new problem. The current episode started yesterday. The problem occurs constantly. The problem has not changed since onset.Pertinent negatives include no abdominal pain. Associated symptoms comments: Cabin crew similarly ill, pt hungry and been eating reg food, taco etc., no blood in vomitus or diarrhea..    Past Medical History  Diagnosis Date  . Migraine    History reviewed. No pertinent past surgical history. Family History  Problem Relation Age of Onset  . Breast cancer Mother    History  Substance Use Topics  . Smoking status: Never Smoker   . Smokeless tobacco: Not on file  . Alcohol Use: No   OB History   Grav Para Term Preterm Abortions TAB SAB Ect Mult Living                 Review of Systems  Constitutional: Negative.   Gastrointestinal: Positive for nausea, vomiting and diarrhea. Negative for abdominal pain.  Genitourinary: Negative.     Allergies  Review of patient's allergies indicates no known allergies.  Home Medications   Current Outpatient Rx  Name  Route  Sig  Dispense  Refill  . ibuprofen (ADVIL,MOTRIN) 600 MG tablet   Oral   Take 1 tablet (600 mg total) by mouth every 6 (six) hours as needed for pain.   30 tablet   0   . medroxyPROGESTERone (DEPO-PROVERA) 150 MG/ML injection   Intramuscular   Inject 150 mg into the muscle every 3 (three) months.         . ondansetron (ZOFRAN) 4 MG tablet   Oral   Take 1 tablet (4 mg total) by mouth every 6 (six) hours. Prn n/v   8 tablet   0   . sulfamethoxazole-trimethoprim (SEPTRA DS) 800-160 MG per tablet   Oral   Take 1 tablet by mouth 2 (two) times daily. X 5  days   10 tablet   0   . traMADol (ULTRAM) 50 MG tablet   Oral   Take 1 tablet (50 mg total) by mouth every 6 (six) hours as needed for pain.   15 tablet   0   . valACYclovir (VALTREX) 1000 MG tablet   Oral   Take 2 tablets (2,000 mg total) by mouth once.   4 tablet   0   . valACYclovir (VALTREX) 1000 MG tablet      2 tabs at one time   2 tablet   12    BP 135/71  Pulse 62  Temp(Src) 98.1 F (36.7 C) (Oral)  Resp 18  SpO2 96% Physical Exam  Nursing note and vitals reviewed. Constitutional: She is oriented to person, place, and time. She appears well-developed and well-nourished.  HENT:  Mouth/Throat: Oropharynx is clear and moist.  Eyes: Conjunctivae are normal. Pupils are equal, round, and reactive to light.  Neck: Normal range of motion. Neck supple.  Cardiovascular: Regular rhythm.   Pulmonary/Chest: Breath sounds normal.  Neurological: She is alert and oriented to person, place, and time.  Skin: Skin is warm and dry.    ED Course  Procedures (including critical care time) Labs Review Labs Reviewed - No data to display Imaging Review No  results found.  MDM   1. Gastroenteritis due to norovirus       Linna Hoff, MD 07/16/13 1807

## 2013-07-16 NOTE — ED Notes (Signed)
Friend at work has stomach virus and came back to work early

## 2013-07-17 ENCOUNTER — Emergency Department (HOSPITAL_COMMUNITY)
Admission: EM | Admit: 2013-07-17 | Discharge: 2013-07-17 | Disposition: A | Discharge status: Home / Self Care | Payer: Medicaid Other | Attending: Emergency Medicine | Admitting: Emergency Medicine

## 2013-07-17 ENCOUNTER — Emergency Department (HOSPITAL_COMMUNITY): Payer: Medicaid Other

## 2013-07-17 DIAGNOSIS — K529 Noninfective gastroenteritis and colitis, unspecified: Secondary | ICD-10-CM

## 2013-07-17 DIAGNOSIS — R112 Nausea with vomiting, unspecified: Secondary | ICD-10-CM | POA: Insufficient documentation

## 2013-07-17 DIAGNOSIS — K5289 Other specified noninfective gastroenteritis and colitis: Secondary | ICD-10-CM | POA: Insufficient documentation

## 2013-07-17 DIAGNOSIS — R197 Diarrhea, unspecified: Secondary | ICD-10-CM | POA: Insufficient documentation

## 2013-07-17 DIAGNOSIS — Z3202 Encounter for pregnancy test, result negative: Secondary | ICD-10-CM | POA: Insufficient documentation

## 2013-07-17 DIAGNOSIS — Z8679 Personal history of other diseases of the circulatory system: Secondary | ICD-10-CM | POA: Insufficient documentation

## 2013-07-17 LAB — GLUCOSE, CAPILLARY: Glucose-Capillary: 83 mg/dL (ref 70–99)

## 2013-07-17 LAB — POCT PREGNANCY, URINE: Preg Test, Ur: NEGATIVE

## 2013-07-17 LAB — HEPATIC FUNCTION PANEL
Alkaline Phosphatase: 55 U/L (ref 39–117)
Bilirubin, Direct: 0.2 mg/dL (ref 0.0–0.3)
Indirect Bilirubin: 0.8 mg/dL (ref 0.3–0.9)
Total Bilirubin: 1 mg/dL (ref 0.3–1.2)

## 2013-07-17 LAB — CBC
HCT: 39.1 % (ref 36.0–46.0)
MCHC: 35 g/dL (ref 30.0–36.0)
MCV: 91.4 fL (ref 78.0–100.0)
Platelets: 229 10*3/uL (ref 150–400)
RDW: 12.9 % (ref 11.5–15.5)

## 2013-07-17 LAB — URINALYSIS, ROUTINE W REFLEX MICROSCOPIC
Bilirubin Urine: NEGATIVE
Hgb urine dipstick: NEGATIVE
Protein, ur: NEGATIVE mg/dL
Urobilinogen, UA: 0.2 mg/dL (ref 0.0–1.0)

## 2013-07-17 LAB — BASIC METABOLIC PANEL
BUN: 8 mg/dL (ref 6–23)
Creatinine, Ser: 0.77 mg/dL (ref 0.50–1.10)
GFR calc Af Amer: >90 mL/min (ref >90)
GFR calc non Af Amer: >90 mL/min (ref >90)
Potassium: 3.9 mEq/L (ref 3.5–5.1)

## 2013-07-17 MED ORDER — SODIUM CHLORIDE 0.9 % IV BOLUS (SEPSIS)
1000.0000 mL | Freq: Once | INTRAVENOUS | Status: AC
  Administered 2013-07-17: 1000 mL via INTRAVENOUS

## 2013-07-17 MED ORDER — ONDANSETRON HCL 4 MG/2ML IJ SOLN
4.0000 mg | Freq: Once | INTRAMUSCULAR | Status: AC
  Administered 2013-07-17: 4 mg via INTRAVENOUS
  Filled 2013-07-17: qty 2

## 2013-07-17 MED ORDER — ONDANSETRON 4 MG PO TBDP: ORAL_TABLET | ORAL | Status: DC

## 2013-07-17 NOTE — ED Notes (Signed)
Pt called in main ED waiting area with no response 

## 2013-07-17 NOTE — ED Notes (Signed)
Pt from home c/o abd pain. Pt was at registration desk, states she felt sweaty and everything went black. Pt fell at registration desk. LSB and C-collar applied. Pt alert post fall. Pt roomed in A12.

## 2013-07-17 NOTE — ED Provider Notes (Signed)
CSN: [id number]     Arrival date & time 07/17/13  1030 History   First MD Initiated Contact with Patient 07/17/13 1044     Chief Complaint  Patient presents with  . Abdominal Pain  . Fall   (Consider location/radiation/quality/duration/timing/severity/associated sxs/prior Treatment) HPI Comments: Seen by physician yesterday - diagnosed with gastroenteritis secondary to Norovirus. Persistent N/V/D - patient was at registration and had syncopal event - had tunnel vision/dark vision prior to event. Initial BP after event 88/70 - repeat systolics in the 130s.   Patient is a 22 y.o. female presenting with abdominal pain and fall. The history is provided by the patient.  Abdominal Pain Pain location:  Generalized Pain quality: cramping and sharp   Pain radiates to:  Does not radiate Pain severity:  Moderate Onset quality:  Gradual Duration:  3 days Timing:  Constant Progression:  Worsening Chronicity:  New Context: not recent illness, not recent travel, not sick contacts and not trauma   Relieved by:  Nothing Worsened by:  Nothing tried Ineffective treatments:  None tried Associated symptoms: diarrhea, nausea and vomiting   Associated symptoms: no chest pain, no cough, no dysuria, no fever, no flatus, no hematemesis and no hematochezia   Risk factors: not pregnant   Fall Associated symptoms include abdominal pain. Pertinent negatives include no chest pain.    Past Medical History  Diagnosis Date  . Migraine    No past surgical history on file. Family History  Problem Relation Age of Onset  . Breast cancer Mother    History  Substance Use Topics  . Smoking status: Never Smoker   . Smokeless tobacco: Not on file  . Alcohol Use: No   OB History   Grav Para Term Preterm Abortions TAB SAB Ect Mult Living                 Review of Systems  Constitutional: Negative for fever.  Respiratory: Negative for cough.   Cardiovascular: Negative for chest pain.  Gastrointestinal:  Positive for nausea, vomiting, abdominal pain and diarrhea. Negative for hematochezia, flatus and hematemesis.  Genitourinary: Negative for dysuria.  All other systems reviewed and are negative.    Allergies  Review of patient's allergies indicates no known allergies.  Home Medications   Current Outpatient Rx  Name  Route  Sig  Dispense  Refill  . ondansetron (ZOFRAN) 4 MG tablet   Oral   Take 1 tablet (4 mg total) by mouth every 6 (six) hours. Prn n/v   8 tablet   0   . ondansetron (ZOFRAN ODT) 4 MG disintegrating tablet      4mg  ODT q4 hours prn nausea/vomit   10 tablet   0    BP 119/95  Pulse 87  Temp(Src) 98.6 F (37 C) (Oral)  Resp 18  SpO2 100% Physical Exam  Nursing note and vitals reviewed. Constitutional: She is oriented to person, place, and time. She appears well-developed and well-nourished. No distress.  HENT:  Head: Normocephalic and atraumatic.  Eyes: EOM are normal. Pupils are equal, round, and reactive to light.  Neck: Normal range of motion. Neck supple.  Cardiovascular: Normal rate and regular rhythm.  Exam reveals no friction rub.   No murmur heard. Pulmonary/Chest: Effort normal and breath sounds normal. No respiratory distress. She has no wheezes. She has no rales.  Abdominal: Soft. She exhibits no distension and no mass. There is tenderness (mild, generalized). There is no rebound and no guarding.  Genitourinary: Cervix exhibits no motion  tenderness and no friability. Right adnexum displays tenderness (very mild). Right adnexum displays no mass and no fullness. Left adnexum displays tenderness (very mild). Left adnexum displays no mass and no fullness.  Musculoskeletal: Normal range of motion. She exhibits no edema.       Cervical back: She exhibits normal range of motion, no tenderness and no bony tenderness.  Neurological: She is alert and oriented to person, place, and time. She exhibits normal muscle tone. Coordination normal.  Skin: No rash  noted. She is not diaphoretic.    ED Course  Procedures (including critical care time) Labs Review Labs Reviewed  WET PREP, GENITAL - Abnormal; Notable for the following:    Clue Cells Wet Prep HPF POC FEW (*)    WBC, Wet Prep HPF POC FEW (*)    All other components within normal limits  URINALYSIS, ROUTINE W REFLEX MICROSCOPIC - Abnormal; Notable for the following:    Ketones, ur >80 (*)    All other components within normal limits  GC/CHLAMYDIA PROBE AMP  CBC  BASIC METABOLIC PANEL  LIPASE, BLOOD  HEPATIC FUNCTION PANEL  GLUCOSE, CAPILLARY  CG4 I-STAT (LACTIC ACID)  POCT PREGNANCY, URINE   Imaging Review Ct Head Wo Contrast  07/17/2013   *RADIOLOGY REPORT*  Clinical Data: Abdominal pain, weakness, fall.  CT HEAD WITHOUT CONTRAST  Technique:  Contiguous axial images were obtained from the base of the skull through the vertex without contrast.  Comparison: Head CT in 12/01/2005  Findings: No acute intracranial hemorrhage.  No focal mass lesion. No CT evidence of acute infarction.   No midline shift or mass effect.  No hydrocephalus.  Basilar cisterns are patent. Paranasal sinuses and mastoid air cells are clear.  Orbits are normal.  IMPRESSION: Normal head c t   Original Report Authenticated By: Genevive Bi, M.D.    MDM   1. Gastroenteritis   2. Nausea vomiting and diarrhea    48F presents with syncope and abdominal pain. Recent diagnosis of gastroenteritis by a physician yesterday - persistent N/V/D and abdominal pain. Unable to tell me if she was having fevers. Patient had syncopal event in triage - placed in c-collar and on a backboard. C-collar cleared clinically upon my exam. Mild R sided headache, no deformities noted. Mild diffuse abdominal pain.  Syncope c/w vasovagal, likely related to some dehydration. Will give fluids, check labs. Will CT Head she complaining of headache after syncope. CT Head normal. Improved after fluids. Mild epigastric pain on exam, likely  secondary to her vomiting. No suprapubic pain. Instructed to f/u with PCP, stable for discharge.     Dagmar Hait, MD 07/17/13 [phone]

## 2013-07-18 ENCOUNTER — Emergency Department (HOSPITAL_COMMUNITY)
Admission: EM | Admit: 2013-07-18 | Discharge: 2013-07-18 | Disposition: A | Discharge status: Home / Self Care | Payer: Medicaid Other | Attending: Emergency Medicine | Admitting: Emergency Medicine

## 2013-07-18 ENCOUNTER — Encounter (HOSPITAL_COMMUNITY): Payer: Self-pay | Admitting: Emergency Medicine

## 2013-07-18 DIAGNOSIS — R112 Nausea with vomiting, unspecified: Secondary | ICD-10-CM | POA: Insufficient documentation

## 2013-07-18 DIAGNOSIS — R197 Diarrhea, unspecified: Secondary | ICD-10-CM | POA: Insufficient documentation

## 2013-07-18 DIAGNOSIS — Z3202 Encounter for pregnancy test, result negative: Secondary | ICD-10-CM | POA: Insufficient documentation

## 2013-07-18 DIAGNOSIS — F410 Panic disorder [episodic paroxysmal anxiety] without agoraphobia: Secondary | ICD-10-CM | POA: Insufficient documentation

## 2013-07-18 DIAGNOSIS — B9689 Other specified bacterial agents as the cause of diseases classified elsewhere: Secondary | ICD-10-CM

## 2013-07-18 DIAGNOSIS — Z8679 Personal history of other diseases of the circulatory system: Secondary | ICD-10-CM | POA: Insufficient documentation

## 2013-07-18 DIAGNOSIS — R55 Syncope and collapse: Secondary | ICD-10-CM | POA: Insufficient documentation

## 2013-07-18 DIAGNOSIS — N760 Acute vaginitis: Secondary | ICD-10-CM | POA: Insufficient documentation

## 2013-07-18 LAB — COMPREHENSIVE METABOLIC PANEL
ALT: 20 U/L (ref 0–35)
AST: 19 U/L (ref 0–37)
Albumin: 3.9 g/dL (ref 3.5–5.2)
CO2: 22 mEq/L (ref 19–32)
Calcium: 9.4 mg/dL (ref 8.4–10.5)
Sodium: 138 mEq/L (ref 135–145)
Total Protein: 7.8 g/dL (ref 6.0–8.3)

## 2013-07-18 LAB — URINALYSIS, ROUTINE W REFLEX MICROSCOPIC
Hgb urine dipstick: NEGATIVE
Protein, ur: NEGATIVE mg/dL
Urobilinogen, UA: 1 mg/dL (ref 0.0–1.0)

## 2013-07-18 LAB — CBC WITH DIFFERENTIAL/PLATELET
Basophils Absolute: 0 10*3/uL (ref 0.0–0.1)
Basophils Relative: 0 % (ref 0–1)
Eosinophils Absolute: 0 10*3/uL (ref 0.0–0.7)
Eosinophils Relative: 1 % (ref 0–5)
Lymphocytes Relative: 31 % (ref 12–46)
MCV: 91.1 fL (ref 78.0–100.0)
Platelets: 221 10*3/uL (ref 150–400)
RDW: 12.8 % (ref 11.5–15.5)
WBC: 6.7 10*3/uL (ref 4.0–10.5)

## 2013-07-18 LAB — GC/CHLAMYDIA PROBE AMP: GC Probe RNA: NEGATIVE

## 2013-07-18 LAB — LIPASE, BLOOD: Lipase: 19 U/L (ref 11–59)

## 2013-07-18 MED ORDER — ONDANSETRON HCL 4 MG/2ML IJ SOLN
4.0000 mg | INTRAMUSCULAR | Status: DC | PRN
  Administered 2013-07-18: 4 mg via INTRAVENOUS
  Filled 2013-07-18: qty 2

## 2013-07-18 MED ORDER — SODIUM CHLORIDE 0.9 % IV BOLUS (SEPSIS)
1000.0000 mL | Freq: Once | INTRAVENOUS | Status: AC
  Administered 2013-07-18: 1000 mL via INTRAVENOUS

## 2013-07-18 MED ORDER — FAMOTIDINE IN NACL 20-0.9 MG/50ML-% IV SOLN
20.0000 mg | Freq: Once | INTRAVENOUS | Status: AC
  Administered 2013-07-18: 20 mg via INTRAVENOUS
  Filled 2013-07-18: qty 50

## 2013-07-18 MED ORDER — METRONIDAZOLE 500 MG PO TABS: 500.0000 mg | ORAL_TABLET | Freq: Two times a day (BID) | ORAL | Status: DC

## 2013-07-18 NOTE — ED Notes (Signed)
Was seen yestereday at [facility] for samething. Pt is have labored breathing, tingling feeling said it due to she has been having diahrrea.

## 2013-07-18 NOTE — ED Provider Notes (Signed)
CSN: [id number]     Arrival date & time 07/18/13  1014 History   First MD Initiated Contact with Patient 07/18/13 1107     Chief Complaint  Patient presents with  . Panic Attack  . Diarrhea    HPI Pt was seen at 1115. Per pt, c/o gradual onset and persistence of multiple intermittent episodes of N/V/D for the past 2 to 3 days.   Describes the stools as "watery." States she has been able to tol PO fluids without N/V since yesterday's evaluation x2 in the ED for same. States the diarrhea continues, but she has been drinking milk/milk products as well as full strength fruit juices.  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 Pt requesting food and drink on arrival to the ED. Denies abd pain, no CP/SOB, no back pain, no fevers, no black or blood in stools or emesis.    Past Medical History  Diagnosis Date  . Migraine    No past surgical history on file.   Family History  Problem Relation Age of Onset  . Breast cancer Mother    History  Substance Use Topics  . Smoking status: Never Smoker   . Smokeless tobacco: Not on file  . Alcohol Use: No    Review of Systems ROS: Statement: All systems negative except as marked or noted in the HPI; Constitutional: Negative for fever and chills. ; ; Eyes: Negative for eye pain, redness and discharge. ; ; ENMT: Negative for ear pain, hoarseness, nasal congestion, sinus pressure and sore throat. ; ; Cardiovascular: Negative for chest pain, palpitations, diaphoresis, dyspnea and peripheral edema. ; ; Respiratory: Negative for cough, wheezing and stridor. ; ; Gastrointestinal: +N/V/D. Negative for abdominal pain, blood in stool, hematemesis, jaundice and rectal bleeding. . ; ; Genitourinary: Negative for dysuria, flank pain and hematuria. ; ; Musculoskeletal: Negative for back pain and neck pain. Negative for swelling and trauma.; ;  Skin: Negative for pruritus, rash, abrasions, blisters, bruising and skin lesion.; ; Neuro: +lightheadedness, near syncope. Negative for headache and neck stiffness. Negative for weakness, altered level of consciousness , altered mental status, extremity weakness, involuntary movement, seizure and syncope.       Allergies  Review of patient's allergies indicates no known allergies.  Home Medications  No current outpatient prescriptions on file. BP 116/89  Pulse 78  Resp 20  SpO2 100% Physical Exam 1120: Physical examination:  Nursing notes reviewed; Vital signs and O2 SAT reviewed;  Constitutional: Well developed, Well nourished, Well hydrated, In no acute distress; Head:  Normocephalic, atraumatic; Eyes: EOMI, PERRL, No scleral icterus; ENMT: Mouth and pharynx normal, Mucous membranes moist; Neck: Supple, Full range of motion, No lymphadenopathy; Cardiovascular: Regular rate and rhythm, No murmur, rub, or gallop; Respiratory: Breath sounds clear & equal bilaterally, No rales, rhonchi, wheezes.  Speaking full sentences with ease, Normal respiratory effort/excursion; Chest: Nontender, Movement normal; Abdomen: Soft, Nontender, Nondistended, Normal bowel sounds; Genitourinary: No CVA tenderness; Extremities: Pulses normal, No tenderness, No edema, No calf edema or asymmetry.; Neuro: AA&Ox3, Major CN grossly intact.  Speech clear. No gross focal motor or sensory deficits in extremities.; Skin: Color normal, Warm, Dry.; Psych:  Anxious.    ED Course  Procedures    MDM  MDM Reviewed: previous chart, nursing note and vitals Reviewed previous: labs and CT scan Interpretation: labs  Ct Head Wo Contrast 07/17/2013   *RADIOLOGY REPORT*  Clinical Data: Abdominal pain, weakness, fall.  CT HEAD WITHOUT CONTRAST  Technique:  Contiguous axial images were obtained from the base of the skull through the vertex without contrast.  Comparison: Head CT in 12/01/2005  Findings: No acute intracranial  hemorrhage.  No focal mass lesion. No CT evidence of acute infarction.   No midline shift or mass effect.  No hydrocephalus.  Basilar cisterns are patent. Paranasal sinuses and mastoid air cells are clear.  Orbits are normal.  IMPRESSION: Normal head c t   Original Report Authenticated By: Genevive Bi, M.D.     Results for orders placed during the hospital encounter of 07/18/13  PREGNANCY, URINE      Result Value Range   Preg Test, Ur NEGATIVE  NEGATIVE  URINALYSIS, ROUTINE W REFLEX MICROSCOPIC      Result Value Range   Color, Urine YELLOW  YELLOW   APPearance CLEAR  CLEAR   Specific Gravity, Urine 1.015  1.005 - 1.030   pH 7.5  5.0 - 8.0   Glucose, UA NEGATIVE  NEGATIVE mg/dL   Hgb urine dipstick NEGATIVE  NEGATIVE   Bilirubin Urine NEGATIVE  NEGATIVE   Ketones, ur >80 (*) NEGATIVE mg/dL   Protein, ur NEGATIVE  NEGATIVE mg/dL   Urobilinogen, UA 1.0  0.0 - 1.0 mg/dL   Nitrite NEGATIVE  NEGATIVE   Leukocytes, UA NEGATIVE  NEGATIVE  CBC WITH DIFFERENTIAL      Result Value Range   WBC 6.7  4.0 - 10.5 K/uL   RBC 4.29  3.87 - 5.11 MIL/uL   Hemoglobin 13.1  12.0 - 15.0 g/dL   HCT 16.1  09.6 - 04.5 %   MCV 91.1  78.0 - 100.0 fL   MCH 30.5  26.0 - 34.0 pg   MCHC 33.5  30.0 - 36.0 g/dL   RDW 40.9  81.1 - 91.4 %   Platelets 221  150 - 400 K/uL   Neutrophils Relative % 60  43 - 77 %   Neutro Abs 4.0  1.7 - 7.7 K/uL   Lymphocytes Relative 31  12 - 46 %   Lymphs Abs 2.1  0.7 - 4.0 K/uL   Monocytes Relative 8  3 - 12 %   Monocytes Absolute 0.5  0.1 - 1.0 K/uL   Eosinophils Relative 1  0 - 5 %   Eosinophils Absolute 0.0  0.0 - 0.7 K/uL   Basophils Relative 0  0 - 1 %   Basophils Absolute 0.0  0.0 - 0.1 K/uL  COMPREHENSIVE METABOLIC PANEL      Result Value Range   Sodium 138  135 - 145 mEq/L   Potassium 3.8  3.5 - 5.1 mEq/L   Chloride 104  96 - 112 mEq/L   CO2 22  19 - 32 mEq/L   Glucose, Bld 86  70 - 99 mg/dL   BUN 6  6 - 23 mg/dL   Creatinine, Ser 7.82  0.50 - 1.10 mg/dL    Calcium 9.4  8.4 - 95.6 mg/dL   Total Protein 7.8  6.0 - 8.3 g/dL   Albumin 3.9  3.5 - 5.2 g/dL   AST 19  0 - 37 U/L   ALT 20  0 - 35 U/L   Alkaline Phosphatase 55  39 - 117 U/L   Total Bilirubin 1.1  0.3 - 1.2 mg/dL   GFR calc non Af Amer >90  >90 mL/min   GFR calc  Af Amer >90  >90 mL/min  LIPASE, BLOOD      Result Value Range   Lipase 19  11 - 59 U/L    1330:  Pt has tol PO well while in the ED without N/V.  No stooling while in the ED.  Abd remains benign, VSS. Not orthostatic. Has ambulated around the ED with steady gait, easy resps. Wants to go home now. In reviewing pt's labs from yesterday, wet prep had +clue cells; will tx with flagyl. Pt already has rx zofran. Dx and testing d/w pt.  Questions answered.  Verb understanding, agreeable to d/c home with outpt f/u.            Laray Anger, DO 07/20/13 1217

## 2013-07-18 NOTE — ED Notes (Signed)
Pt ambulatory.

## 2013-07-18 NOTE — ED Notes (Signed)
Pt tolerating po fluids

## 2013-08-04 ENCOUNTER — Ambulatory Visit: Payer: Medicaid Other | Admitting: Internal Medicine

## 2013-10-09 ENCOUNTER — Emergency Department (HOSPITAL_COMMUNITY)
Admission: EM | Admit: 2013-10-09 | Discharge: 2013-10-09 | Disposition: A | Discharge status: Home / Self Care | Payer: Medicaid Other | Attending: Emergency Medicine | Admitting: Emergency Medicine

## 2013-10-09 ENCOUNTER — Encounter (HOSPITAL_COMMUNITY): Payer: Self-pay | Admitting: Emergency Medicine

## 2013-10-09 DIAGNOSIS — Z3202 Encounter for pregnancy test, result negative: Secondary | ICD-10-CM | POA: Insufficient documentation

## 2013-10-09 DIAGNOSIS — R1110 Vomiting, unspecified: Secondary | ICD-10-CM | POA: Insufficient documentation

## 2013-10-09 DIAGNOSIS — Z8679 Personal history of other diseases of the circulatory system: Secondary | ICD-10-CM | POA: Insufficient documentation

## 2013-10-09 DIAGNOSIS — R0600 Dyspnea, unspecified: Secondary | ICD-10-CM

## 2013-10-09 DIAGNOSIS — R0609 Other forms of dyspnea: Secondary | ICD-10-CM | POA: Insufficient documentation

## 2013-10-09 DIAGNOSIS — R0989 Other specified symptoms and signs involving the circulatory and respiratory systems: Secondary | ICD-10-CM | POA: Insufficient documentation

## 2013-10-09 DIAGNOSIS — R002 Palpitations: Secondary | ICD-10-CM | POA: Insufficient documentation

## 2013-10-09 DIAGNOSIS — Z79899 Other long term (current) drug therapy: Secondary | ICD-10-CM | POA: Insufficient documentation

## 2013-10-09 DIAGNOSIS — R259 Unspecified abnormal involuntary movements: Secondary | ICD-10-CM | POA: Insufficient documentation

## 2013-10-09 HISTORY — DX: Anxiety disorder, unspecified: F41.9

## 2013-10-09 MED ORDER — ONDANSETRON HCL 4 MG PO TABS: 4.0000 mg | ORAL_TABLET | Freq: Four times a day (QID) | ORAL | Status: DC

## 2013-10-09 MED ORDER — ONDANSETRON 4 MG PO TBDP
4.0000 mg | ORAL_TABLET | Freq: Once | ORAL | Status: AC
  Administered 2013-10-09: 4 mg via ORAL
  Filled 2013-10-09: qty 1

## 2013-10-09 NOTE — ED Notes (Signed)
Pt. woke up this morning with anxiety attack , pt. does not take any medications for her anxiety .

## 2013-10-09 NOTE — ED Provider Notes (Signed)
CSN: [id number]     Arrival date & time 10/09/13  0419 History   First MD Initiated Contact with Patient 10/09/13 0518     Chief Complaint  Patient presents with  . Anxiety   (Consider location/radiation/quality/duration/timing/severity/associated sxs/prior Treatment) HPI Comments: Pt is a 22 y.o. female with Pmhx as above who presents with episode of sudden onset SOB, shaking, palpitations about 1 hr prior to arrival that woke her from sleep, then had several episodes of n/v once in ED.  Denies CP, ab pain, fever, URI symptoms, d/a, urinary symptoms.  No hx of similar symptoms. Pt currently feeling improved.  No known aggravating or alleviating symptoms. Emesis was non-bloody, non-bilious.   Past Medical History  Diagnosis Date  . Migraine   . Anxiety    History reviewed. No pertinent past surgical history. Family History  Problem Relation Age of Onset  . Breast cancer Mother    History  Substance Use Topics  . Smoking status: Never Smoker   . Smokeless tobacco: Not on file  . Alcohol Use: No   OB History   Grav Para Term Preterm Abortions TAB SAB Ect Mult Living                 Review of Systems  Constitutional: Negative for fever, chills, diaphoresis, activity change, appetite change and fatigue.  HENT: Negative for congestion, facial swelling, rhinorrhea and sore throat.   Eyes: Negative for photophobia and discharge.  Respiratory: Positive for shortness of breath. Negative for cough and chest tightness.   Cardiovascular: Positive for palpitations. Negative for chest pain and leg swelling.  Gastrointestinal: Negative for nausea, vomiting, abdominal pain and diarrhea.  Endocrine: Negative for polydipsia and polyuria.  Genitourinary: Negative for dysuria, frequency, difficulty urinating and pelvic pain.  Musculoskeletal: Negative for arthralgias, back pain, neck pain and neck stiffness.  Skin: Negative for color change and wound.  Allergic/Immunologic: Negative for  immunocompromised state.  Neurological: Negative for facial asymmetry, weakness, numbness and headaches.  Hematological: Does not bruise/bleed easily.  Psychiatric/Behavioral: Negative for confusion and agitation.    Allergies  Review of patient's allergies indicates no known allergies.  Home Medications   Current Outpatient Rx  Name  Route  Sig  Dispense  Refill  . Levonorgestrel-Ethinyl Estradiol (SEASONIQUE) 0.15-0.03 &0.01 MG tablet   Oral   Take 1 tablet by mouth daily.         . ondansetron (ZOFRAN) 4 MG tablet   Oral   Take 1 tablet (4 mg total) by mouth every 6 (six) hours.   12 tablet   0    BP 119/76  Pulse 69  Temp(Src) 97.8 F (36.6 C) (Oral)  Resp 18  SpO2 98% Physical Exam  Constitutional: She is oriented to person, place, and time. She appears well-developed and well-nourished. No distress.  HENT:  Head: Normocephalic and atraumatic.  Mouth/Throat: No oropharyngeal exudate.  Eyes: Pupils are equal, round, and reactive to light.  Neck: Normal range of motion. Neck supple.  Cardiovascular: Normal rate, regular rhythm and normal heart sounds.  Exam reveals no gallop and no friction rub.   No murmur heard. Pulmonary/Chest: Effort normal and breath sounds normal. No respiratory distress. She has no wheezes. She has no rales.  Abdominal: Soft. Bowel sounds are normal. She exhibits no distension and no mass. There is no tenderness. There is no rebound and no guarding.  Musculoskeletal: Normal range of motion. She exhibits no edema and no tenderness.  Neurological: She is alert and oriented to  person, place, and time.  Skin: Skin is warm and dry.  Psychiatric: She has a normal mood and affect.    ED Course  Procedures (including critical care time) Labs Review Labs Reviewed  POCT PREGNANCY, URINE   Imaging Review No results found.  EKG Interpretation    Date/Time:  Thursday October 09 2013 05:41:30 EST Ventricular Rate:  73 PR Interval:  154 QRS  Duration: 83 QT Interval:  378 QTC Calculation: 416 R Axis:   80 Text Interpretation:  Sinus rhythm EKG WITHIN NORMAL LIMITS Confirmed by Abishai Viegas  MD, Aiman Noe ([id number]) on 10/09/2013 6:01:16 AM            MDM   1. Dyspnea   2. Vomiting    Pt is a 22 y.o. female with Pmhx as above who presents with episode of sudden onset SOB, shaking, palpitations about 1 hr prior to arrival that woke her from sleep, then had several episodes of n/v once in ED.  Denies CP, ab pain, fever, URI symptoms, d/a, urinary symptoms.  No hx of similar symptoms. Pt currently feeling improved.  On PE, VSS, pt in NAD.  EKG nml.  Cardiopulm & LE exam unremarkable. POC preg negative. AS pt well appearing & asymptomatic, I do not believe further w/u needed in ED.  Doubt ACS, pna, PE.  Return precautions given for new or worsening symptoms including continued symptoms, chest pain, fever, leg swelling.          Shanna Cisco, MD 10/09/13 2108

## 2013-12-23 ENCOUNTER — Institutional Professional Consult (permissible substitution): Payer: Medicaid Other | Admitting: Nurse Practitioner

## 2014-01-30 ENCOUNTER — Encounter: Payer: Self-pay | Admitting: Dietician

## 2014-01-30 ENCOUNTER — Encounter: Payer: Medicaid Other | Attending: Physician Assistant | Admitting: Dietician

## 2014-01-30 VITALS — Ht 67.0 in | Wt 166.3 lb

## 2014-01-30 DIAGNOSIS — Z713 Dietary counseling and surveillance: Secondary | ICD-10-CM | POA: Insufficient documentation

## 2014-01-30 DIAGNOSIS — R519 Headache, unspecified: Secondary | ICD-10-CM | POA: Insufficient documentation

## 2014-01-30 DIAGNOSIS — R51 Headache: Secondary | ICD-10-CM | POA: Insufficient documentation

## 2014-01-30 NOTE — Progress Notes (Signed)
Medical Nutrition Therapy:  Appt start time: 0930 end time:  1000.  Assessment:  Primary concerns today: Pt recently lost wt of about 18 pounds over a few months after changing birth control methods. Since changing back, she has reported no further issues in terms of vomiting or additional wt loss. Her current weight is the same as it was this time last year. She also reports very pronounced headaches, and is concerned that sodium may be a trigger. She works, attends school, and has a child to care for, so she admits stress may be the major issue.  Pt shows no signs of protein-energy malnutrition. Negative for hair or scalp changes or coarsening. Negative for collar bone protrusion or decreased skin turgor.   Pt reports low sugar intake, no EtOH, minimal caffeine. She does admit that she uses a large amount of canned products in her cooking, and that she did not realize the sodium content was very high.  Preferred Learning Style:   No preference indicated   Learning Readiness:   Contemplating  MEDICATIONS: see list.  Progress Towards Goal(s):  In progress.   Nutritional Diagnosis:  NB-1.1 Food and nutrition-related knowledge deficit As related to sodium intake.  As evidenced by pt reprots that she utilizes almost exclusively canned vegetables and a variety of other canned products.    Intervention:  No concerns about wt status, as it has remained stable since last MD visit, and is the same as March 2014.   RD provided general nutrition info for wt maintenance, overall health. RD also described and provided materials on dietary intervention to prevent intracranial hypertension, as this may be a cause of her sever headaches. General hydration and stress control also discussed as likely preventative treatments for headaches.  In particular, RD recommended reducing sodium intake by purchasing more frozen vegetables as opposed to canned. RD also highlighted very high vitamin A foods to limit in  the diet to prevent intracranial hypertension. RD recommended yoga and walking as options to reduce stress, improve overall health and wt control.  Teaching Method Utilized:  Visual Auditory  Handouts given during visit include:  Best Protein, Fat, and Carb rich foods for health  Vitamin A fact sheet  Barriers to learning/adherence to lifestyle change: busy, stressful lifestyle  Demonstrated degree of understanding via:  Teach Back   Monitoring/Evaluation:  Dietary intake, exercise, and body weight prn. RD's card provided. Pt will call or e-mail with specific questions or concerns, and need for a F/U may be evaluated.

## 2014-02-02 ENCOUNTER — Other Ambulatory Visit: Payer: Self-pay | Admitting: Family

## 2014-02-02 DIAGNOSIS — N644 Mastodynia: Secondary | ICD-10-CM

## 2014-02-06 ENCOUNTER — Other Ambulatory Visit: Payer: Medicaid Other

## 2014-02-10 ENCOUNTER — Other Ambulatory Visit: Payer: Medicaid Other

## 2014-03-06 ENCOUNTER — Encounter (HOSPITAL_COMMUNITY): Payer: Self-pay | Admitting: Emergency Medicine

## 2014-03-06 ENCOUNTER — Emergency Department (HOSPITAL_COMMUNITY)
Admission: EM | Admit: 2014-03-06 | Discharge: 2014-03-06 | Discharge status: Against Medical Advice | Payer: Medicaid Other | Source: Home / Self Care

## 2014-03-06 ENCOUNTER — Emergency Department (HOSPITAL_COMMUNITY)
Admission: AD | Admit: 2014-03-06 | Discharge: 2014-03-07 | Disposition: A | Discharge status: Home / Self Care | Payer: Medicaid Other | Source: Ambulatory Visit | Attending: Emergency Medicine | Admitting: Emergency Medicine

## 2014-03-06 ENCOUNTER — Encounter (HOSPITAL_COMMUNITY): Payer: Self-pay

## 2014-03-06 DIAGNOSIS — R42 Dizziness and giddiness: Secondary | ICD-10-CM | POA: Insufficient documentation

## 2014-03-06 DIAGNOSIS — R002 Palpitations: Secondary | ICD-10-CM | POA: Insufficient documentation

## 2014-03-06 DIAGNOSIS — Z8659 Personal history of other mental and behavioral disorders: Secondary | ICD-10-CM | POA: Insufficient documentation

## 2014-03-06 DIAGNOSIS — R109 Unspecified abdominal pain: Secondary | ICD-10-CM | POA: Insufficient documentation

## 2014-03-06 DIAGNOSIS — R51 Headache: Secondary | ICD-10-CM | POA: Insufficient documentation

## 2014-03-06 DIAGNOSIS — F1210 Cannabis abuse, uncomplicated: Secondary | ICD-10-CM | POA: Insufficient documentation

## 2014-03-06 DIAGNOSIS — R000 Tachycardia, unspecified: Secondary | ICD-10-CM

## 2014-03-06 DIAGNOSIS — R0602 Shortness of breath: Secondary | ICD-10-CM | POA: Insufficient documentation

## 2014-03-06 DIAGNOSIS — Z791 Long term (current) use of non-steroidal anti-inflammatories (NSAID): Secondary | ICD-10-CM | POA: Insufficient documentation

## 2014-03-06 DIAGNOSIS — Z8679 Personal history of other diseases of the circulatory system: Secondary | ICD-10-CM | POA: Insufficient documentation

## 2014-03-06 LAB — URINALYSIS, ROUTINE W REFLEX MICROSCOPIC
GLUCOSE, UA: NEGATIVE mg/dL
KETONES UR: 15 mg/dL — AB
Nitrite: NEGATIVE
PH: 6 (ref 5.0–8.0)
Protein, ur: NEGATIVE mg/dL
Specific Gravity, Urine: 1.025 (ref 1.005–1.030)
Urobilinogen, UA: 0.2 mg/dL (ref 0.0–1.0)

## 2014-03-06 LAB — URINE MICROSCOPIC-ADD ON

## 2014-03-06 LAB — CBC
HCT: 39.1 % (ref 36.0–46.0)
Hemoglobin: 13.1 g/dL (ref 12.0–15.0)
MCH: 31.8 pg (ref 26.0–34.0)
MCHC: 33.5 g/dL (ref 30.0–36.0)
MCV: 94.9 fL (ref 78.0–100.0)
PLATELETS: 196 10*3/uL (ref 150–400)
RBC: 4.12 MIL/uL (ref 3.87–5.11)
RDW: 12.7 % (ref 11.5–15.5)
WBC: 6.1 10*3/uL (ref 4.0–10.5)

## 2014-03-06 LAB — POCT PREGNANCY, URINE: Preg Test, Ur: NEGATIVE

## 2014-03-06 NOTE — MAU Provider Note (Signed)
Chief Complaint: Dizziness, Shortness of Breath and Abdominal Pain   First Provider Initiated Contact with Patient 03/06/14 2338     SUBJECTIVE HPI: Nicole Olson is a 23 y.o. G1P1001 who presents to maternity admissions reporting episodes several times/day x1 week of dizziness, shortness of breath, and abdominal pain.  The episodes last a few seconds and then go away.  She reports she was not concerned when episodes started, she thought maybe it was hot weather, or she was dehydrated but symptoms are worsening causing her to be afraid when driving.  The episodes occur at random when lying down, walking, driving, with no known trigger.  She went to [facility] tonight but felt dizzy in the waiting room after waiting a long time, so [facility].   She reports she had some episodes of dizziness and near syncope a few months ago, when she was taking Seasonique birth control pills but these episodes stopped after discontinuing the medication.   She did also request a pregnancy test upon arrival and reports intermittent abdominal cramping, mostly in the LLQ.  She does report some constipation for last few days.  She is on Depo Provera for contraception.  She denies vaginal bleeding, vaginal itching/burning, urinary symptoms, h/a, or fever/chills.    Past Medical History  Diagnosis Date  . Migraine   . Anxiety    Past Surgical History  Procedure Laterality Date  . No past surgeries     History   Social History  . Marital Status: Single    Spouse Name: N/A    Number of Children: N/A  . Years of Education: N/A   Occupational History  . Not on file.   Social History Main Topics  . Smoking status: Never Smoker   . Smokeless tobacco: Not on file  . Alcohol Use: No  . Drug Use: Yes    Special: Marijuana     Comment: "6 days ago"  . Sexual Activity: Yes    Birth Control/ Protection: Injection   Other Topics Concern  . Not on file   Social History Narrative   . No narrative on file   No current facility-administered medications on file prior to encounter.   Current Outpatient Prescriptions on File Prior to Encounter  Medication Sig Dispense Refill  . Levonorgestrel-Ethinyl Estradiol (SEASONIQUE) 0.15-0.03 &0.01 MG tablet Take 1 tablet by mouth daily.      . ondansetron (ZOFRAN) 4 MG tablet Take 1 tablet (4 mg total) by mouth every 6 (six) hours.  12 tablet  0   No Known Allergies  ROS: Pertinent items in HPI  OBJECTIVE Blood pressure 110/71, pulse 67, temperature 98.2 F (36.8 C), temperature source Oral, resp. rate 18, height 5\' 7"  (1.702 m), weight 67.949 kg (149 lb 12.8 oz), SpO2 100.00%.  Patient Vitals for the past 24 hrs:  BP Temp Temp src Pulse Resp SpO2 Height Weight  03/06/14 2328 110/71 mmHg 98.2 F (36.8 C) Oral 67 18 100 % - -  03/06/14 2244 145/52 mmHg - - 68 - 100 % - -  03/06/14 2243 - 98.7 F (37.1 C) - - 18 - 5\' 7"  (1.702 m) 67.949 kg (149 lb 12.8 oz)   GENERAL: Well-developed, well-nourished female in mild distress.  HEENT: Normocephalic HEART: normal rate, rhythm, heart sounds during initial examination, but tachycardia noted with pulse ox and manually taken pulse during 2 witnessed episodes of dizziness RESP: normal effort, lung sounds clear and equal bilaterally ABDOMEN: Soft, non-tender,  no rebound tenderness, no guarding EXTREMITIES: Nontender, no edema NEURO: Alert and oriented SPECULUM EXAM: Deferred  LAB RESULTS Results for orders placed during the hospital encounter of 03/06/14 (from the past 24 hour(s))  URINALYSIS, ROUTINE W REFLEX MICROSCOPIC     Status: Abnormal   Collection Time    03/06/14 10:50 PM      Result Value Ref Range   Color, Urine YELLOW  YELLOW   APPearance CLEAR  CLEAR   Specific Gravity, Urine 1.025  1.005 - 1.030   pH 6.0  5.0 - 8.0   Glucose, UA NEGATIVE  NEGATIVE mg/dL   Hgb urine dipstick SMALL (*) NEGATIVE   Bilirubin Urine SMALL (*) NEGATIVE   Ketones, ur 15 (*) NEGATIVE  mg/dL   Protein, ur NEGATIVE  NEGATIVE mg/dL   Urobilinogen, UA 0.2  0.0 - 1.0 mg/dL   Nitrite NEGATIVE  NEGATIVE   Leukocytes, UA TRACE (*) NEGATIVE  URINE MICROSCOPIC-ADD ON     Status: Abnormal   Collection Time    03/06/14 10:50 PM      Result Value Ref Range   Squamous Epithelial / LPF RARE  RARE   WBC, UA 3-6  <3 WBC/hpf   RBC / HPF 7-10  <3 RBC/hpf   Bacteria, UA FEW (*) RARE  POCT PREGNANCY, URINE     Status: None   Collection Time    03/06/14 10:55 PM      Result Value Ref Range   Preg Test, Ur NEGATIVE  NEGATIVE  CBC     Status: None   Collection Time    03/06/14 11:35 PM      Result Value Ref Range   WBC 6.1  4.0 - 10.5 K/uL   RBC 4.12  3.87 - 5.11 MIL/uL   Hemoglobin 13.1  12.0 - 15.0 g/dL   HCT 16.139.1  09.636.0 - 04.546.0 %   MCV 94.9  78.0 - 100.0 fL   MCH 31.8  26.0 - 34.0 pg   MCHC 33.5  30.0 - 36.0 g/dL   RDW 40.912.7  81.111.5 - 91.415.5 %   Platelets 196  150 - 400 K/uL  COMPREHENSIVE METABOLIC PANEL     Status: None   Collection Time    03/06/14 11:35 PM      Result Value Ref Range   Sodium 141  137 - 147 mEq/L   Potassium 3.7  3.7 - 5.3 mEq/L   Chloride 105  96 - 112 mEq/L   CO2 24  19 - 32 mEq/L   Glucose, Bld 92  70 - 99 mg/dL   BUN 7  6 - 23 mg/dL   Creatinine, Ser 7.820.81  0.50 - 1.10 mg/dL   Calcium 9.6  8.4 - 95.610.5 mg/dL   Total Protein 6.9  6.0 - 8.3 g/dL   Albumin 3.9  3.5 - 5.2 g/dL   AST 11  0 - 37 U/L   ALT 10  0 - 35 U/L   Alkaline Phosphatase 43  39 - 117 U/L   Total Bilirubin 1.1  0.3 - 1.2 mg/dL   GFR calc non Af Amer >90  >90 mL/min   GFR calc Af Amer >90  >90 mL/min   EKG: sinus bradycardia, marked sinus arrhythmia . ASSESSMENT 1. Dizziness   2. Shortness of breath   3. Tachycardia with 141 - 160 beats per minute     [facility] Transfer accepted by Dr Criss AlvineGoldston NS IV started at 125 prior to  transfer     Medication List    ASK your doctor about these medications       ibuprofen 800 MG tablet   Commonly known as:  ADVIL,MOTRIN  Take 800 mg by mouth every 8 (eight) hours as needed.     ondansetron 4 MG tablet  Commonly known as:  ZOFRAN  Take 1 tablet (4 mg total) by mouth every 6 (six) hours.     SEASONIQUE 0.15-0.03 &0.01 MG tablet  Generic drug:  Levonorgestrel-Ethinyl Estradiol  Take 1 tablet by mouth daily.         Sharen CounterLisa Leftwich-Kirby Certified Nurse-Midwife 03/07/2014  12:32 AM

## 2014-03-06 NOTE — MAU Note (Addendum)
For 2 wks I have been getting really dizzy and when that happens, I get short of breath. Having some pain in stomach for 2 wks

## 2014-03-06 NOTE — ED Notes (Signed)
Pt did not wish to wait any longer. [facility]

## 2014-03-06 NOTE — ED Notes (Signed)
Pt. reports intermittent dizziness / lightheaded for 2 weeks . Denies head injury , alert and oriented / ambulatory / respirations unlabored .

## 2014-03-07 ENCOUNTER — Encounter (HOSPITAL_COMMUNITY): Payer: Self-pay | Admitting: Emergency Medicine

## 2014-03-07 DIAGNOSIS — R42 Dizziness and giddiness: Secondary | ICD-10-CM | POA: Diagnosis present

## 2014-03-07 LAB — COMPREHENSIVE METABOLIC PANEL
ALT: 10 U/L (ref 0–35)
AST: 11 U/L (ref 0–37)
Albumin: 3.9 g/dL (ref 3.5–5.2)
Alkaline Phosphatase: 43 U/L (ref 39–117)
BILIRUBIN TOTAL: 1.1 mg/dL (ref 0.3–1.2)
BUN: 7 mg/dL (ref 6–23)
CALCIUM: 9.6 mg/dL (ref 8.4–10.5)
CHLORIDE: 105 meq/L (ref 96–112)
CO2: 24 meq/L (ref 19–32)
Creatinine, Ser: 0.81 mg/dL (ref 0.50–1.10)
GFR calc Af Amer: >90 mL/min (ref >90)
Glucose, Bld: 92 mg/dL (ref 70–99)
Potassium: 3.7 mEq/L (ref 3.7–5.3)
SODIUM: 141 meq/L (ref 137–147)
Total Protein: 6.9 g/dL (ref 6.0–8.3)

## 2014-03-07 MED ORDER — SODIUM CHLORIDE 0.9 % IV SOLN: INTRAVENOUS | Status: DC

## 2014-03-07 NOTE — ED Notes (Signed)
[facility]  Pt [facility] IV in place left hand [facility]

## 2014-03-07 NOTE — Progress Notes (Signed)
Pt c/o of feeling short of breath and dizzy when pulse ox alarmed 87 %. Pulse palpated regular rhythm and respirations are regular, no visible shortness of breath noted at this time.

## 2014-03-07 NOTE — ED Notes (Signed)
Pt reports she feels dizzy, then gets short of breath off and on for last two weeks.  Pt originally came here but the wait was too long [facility]  Transferred here for further evaluation.  [facility].

## 2014-03-07 NOTE — Progress Notes (Signed)
Pt complained of feeling short of breath and dizzy when pulse ox alarmed at 87 %.  Pulse palpated, regular rhythm.

## 2014-03-07 NOTE — ED Provider Notes (Signed)
CSN: [id number]     Arrival date & time 03/06/14  2230 History   First MD Initiated Contact with Patient 03/07/14 [phone]     Chief Complaint  Patient presents with  . Dizziness  . Shortness of Breath  . Abdominal Pain     (Consider location/radiation/quality/duration/timing/severity/associated sxs/prior Treatment) HPI  23 yo [facility]  She has had intermittent spells lasting 10 minutes over the last two weeks increasing in frequency.  Spells have left sided headache, dizziness, shortness of breath, and palpitations.  Pt has history of migraines, but pain not like migraines.    [facility] but due [facility], [facility].  [facility], but unable to catch on monitor or ekg. her workup there was unremarkable.  Patient denies previous history of same.  Patient reports she smoked marijuana about a week ago, but no illicits and substances since that time.  No family history of cardiac issues.  Symptoms last only 10 minutes, make her feel generally bad and then resolve.  She denies any symptoms at present. No leg swelling, chest pain, persistent sob.    Past Medical History  Diagnosis Date  . Migraine   . Anxiety    Past Surgical History  Procedure Laterality Date  . No past surgeries     Family History  Problem Relation Age of Onset  . Breast cancer Mother    History  Substance Use Topics  . Smoking status: Never Smoker   . Smokeless tobacco: Not on file  . Alcohol Use: No   OB History   Grav Para Term Preterm Abortions TAB SAB Ect Mult Living   1 1 1       1      Review of Systems  See History of Present Illness; otherwise all other systems are reviewed and negative   Allergies  Review of patient's allergies indicates no known allergies.  Home Medications   Prior to Admission medications   Medication Sig Start Date End Date Taking?  Authorizing Provider  ibuprofen (ADVIL,MOTRIN) 800 MG tablet Take 800 mg by mouth every 8 (eight) hours as needed.   Yes Historical Provider, MD   BP 121/84  Pulse 62  Temp(Src) 99.3 F (37.4 C) (Oral)  Resp 14  Ht 5\' 7"  (1.702 m)  Wt 149 lb 8 oz (67.813 kg)  BMI 23.41 kg/m2  SpO2 99% Physical Exam  Nursing note and vitals reviewed. Constitutional: She is oriented to person, place, and time. She appears well-developed and well-nourished.  HENT:  Head: Normocephalic and atraumatic.  Right Ear: External ear normal.  Left Ear: External ear normal.  Nose: Nose normal.  Mouth/Throat: Oropharynx is clear and moist.  Eyes: Conjunctivae and EOM are normal. Pupils are equal, round, and reactive to light.  Neck: Normal range of motion. Neck supple. No JVD present. No tracheal deviation present. No thyromegaly present.  Cardiovascular: Normal rate, regular rhythm, normal heart sounds and intact distal pulses.  Exam reveals no gallop and no friction rub.   No murmur heard. Pulmonary/Chest: Effort normal and breath sounds normal. No stridor. No respiratory distress. She has no wheezes. She has no rales. She exhibits no tenderness.  Abdominal: Soft. Bowel sounds are normal. She exhibits no distension and no mass. There is no tenderness. There is no rebound and no guarding.  Musculoskeletal: Normal range of motion. She exhibits no edema and  no tenderness.  Lymphadenopathy:    She has no cervical adenopathy.  Neurological: She is alert and oriented to person, place, and time. She has normal reflexes. No cranial nerve deficit. She exhibits normal muscle tone. Coordination normal.  Skin: Skin is warm and dry. No rash noted. No erythema. No pallor.  Psychiatric: She has a normal mood and affect. Her behavior is normal. Judgment and thought content normal.    ED Course  Procedures (including critical care time) Labs Review Labs Reviewed  URINALYSIS, ROUTINE W REFLEX MICROSCOPIC - Abnormal; Notable  for the following:    Hgb urine dipstick SMALL (*)    Bilirubin Urine SMALL (*)    Ketones, ur 15 (*)    Leukocytes, UA TRACE (*)    All other components within normal limits  URINE MICROSCOPIC-ADD ON - Abnormal; Notable for the following:    Bacteria, UA FEW (*)    All other components within normal limits  CBC  COMPREHENSIVE METABOLIC PANEL  POCT PREGNANCY, URINE    Imaging Review No results found.   EKG Interpretation   Date/Time:  Saturday March 07 2014 01:34:46 EDT Ventricular Rate:  60 PR Interval:  129 QRS Duration: 81 QT Interval:  425 QTC Calculation: 425 R Axis:   78 Text Interpretation:  Sinus rhythm Atrial premature complex Nonspecific T  abnrm, anterolateral leads Confirmed by Eulia Hatcher  MD, Prashant Glosser ([id number]) on  03/07/2014 3:22:11 AM      MDM   Final diagnoses:  Palpitations  SOB (shortness of breath)   Workup here has been unremarkable.  Patient has not had any symptoms during her two-hour stay in the ED.  Perc negative.  [facility] for holter.  Pt not wishing further workup in the ED, frustrated with long workup time tonight.  Olivia Mackielga M Alcie Runions, MD 03/08/14 [phone]

## 2014-03-07 NOTE — ED Notes (Signed)
Pt's 02 sats and HR did not change while ambulating however RR increased. MD informed.

## 2014-03-07 NOTE — Discharge Instructions (Signed)
Your work up today has not shown a specific cause for your symptoms.  Further workup is indicated.  Please contact the number listed above to establish follow up care. Return to the ER for worsening condition or new concerning symptoms.  Increase your fluid intake.   Dizziness Dizziness is a common problem. It is a feeling of unsteadiness or lightheadedness. You may feel like you are about to faint. Dizziness can lead to injury if you stumble or fall. A person of any age group can suffer from dizziness, but dizziness is more common in older adults. CAUSES  Dizziness can be caused by many different things, including:  Middle ear problems.  Standing for too long.  Infections.  An allergic reaction.  Aging.  An emotional response to something, such as the sight of blood.  Side effects of medicines.  Fatigue.  Problems with circulation or blood pressure.  Excess use of alcohol, medicines, or illegal drug use.  Breathing too fast (hyperventilation).  An arrhythmia or problems with your heart rhythm.  Low red blood cell count (anemia).  Pregnancy.  Vomiting, diarrhea, fever, or other illnesses that cause dehydration.  Diseases or conditions such as Parkinson's disease, high blood pressure (hypertension), diabetes, and thyroid problems.  Exposure to extreme heat. DIAGNOSIS  To find the cause of your dizziness, your caregiver may do a physical exam, lab tests, radiologic imaging scans, or an electrocardiography test (ECG).  TREATMENT  Treatment of dizziness depends on the cause of your symptoms and can vary greatly. HOME CARE INSTRUCTIONS   Drink enough fluids to keep your urine clear or pale yellow. This is especially important in very hot weather. In the elderly, it is also important in cold weather.  If your dizziness is caused by medicines, take them exactly as directed. When taking blood pressure medicines, it is especially important to get up slowly.  Rise slowly from  chairs and steady yourself until you feel okay.  In the morning, first sit up on the side of the bed. When this seems okay, stand slowly while holding onto something until you know your balance is fine.  If you need to stand in one place for a long time, be sure to move your legs often. Tighten and relax the muscles in your legs while standing.  If dizziness continues to be a problem, have someone stay with you for a day or two. Do this until you feel you are well enough to stay alone. Have the person call your caregiver if he or she notices changes in you that are concerning.  Do not drive or use heavy machinery if you feel dizzy.  Do not drink alcohol. SEEK IMMEDIATE MEDICAL CARE IF:   Your dizziness or lightheadedness gets worse.  You feel nauseous or vomit.  You develop problems with talking, walking, weakness, or using your arms, hands, or legs.  You are not thinking clearly or you have difficulty forming sentences. It may take a friend or family member to determine if your thinking is normal.  You develop chest pain, abdominal pain, shortness of breath, or sweating.  Your vision changes.  You notice any bleeding.  You have side effects from medicine that seems to be getting worse rather than better. MAKE SURE YOU:   Understand these instructions.  Will watch your condition.  Will get help right away if you are not doing well or get worse. Document Released: 04/25/2001 Document Revised: 01/22/2012 Document Reviewed: 05/19/2011 [facility] Patient Information 2014 [place], [place].  Palpitations  A palpitation is the feeling that your heartbeat is irregular or is faster than normal. It may feel like your heart is fluttering or skipping a beat. Palpitations are usually not a serious problem. However, in some cases, you may need further medical evaluation. CAUSES  Palpitations can be caused by:  Smoking.  Caffeine or other stimulants, such as diet pills or energy  drinks.  Alcohol.  Stress and anxiety.  Strenuous physical activity.  Fatigue.  Certain medicines.  Heart disease, especially if you have a history of arrhythmias. This includes atrial fibrillation, atrial flutter, or supraventricular tachycardia.  An improperly working pacemaker or defibrillator. DIAGNOSIS  To find the cause of your palpitations, your caregiver will take your history and perform a physical exam. Tests may also be done, including:  Electrocardiography (ECG). This test records the heart's electrical activity.  Cardiac monitoring. This allows your caregiver to monitor your heart rate and rhythm in real time.  Holter monitor. This is a portable device that records your heartbeat and can help diagnose heart arrhythmias. It allows your caregiver to track your heart activity for several days, if needed.  Stress tests by exercise or by giving medicine that makes the heart beat faster. TREATMENT  Treatment of palpitations depends on the cause of your symptoms and can vary greatly. Most cases of palpitations do not require any treatment other than time, relaxation, and monitoring your symptoms. Other causes, such as atrial fibrillation, atrial flutter, or supraventricular tachycardia, usually require further treatment. HOME CARE INSTRUCTIONS   Avoid:  Caffeinated coffee, tea, soft drinks, diet pills, and energy drinks.  Chocolate.  Alcohol.  Stop smoking if you smoke.  Reduce your stress and anxiety. Things that can help you relax include:  A method that measures bodily functions so you can learn to control them (biofeedback).  Yoga.  Meditation.  Physical activity such as swimming, jogging, or walking.  Get plenty of rest and sleep. SEEK MEDICAL CARE IF:   You continue to have a fast or irregular heartbeat beyond 24 hours.  Your palpitations occur more often. SEEK IMMEDIATE MEDICAL CARE IF:  You develop chest pain or shortness of breath.  You have a  severe headache.  You feel dizzy, or you faint. MAKE SURE YOU:  Understand these instructions.  Will watch your condition.  Will get help right away if you are not doing well or get worse. Document Released: 10/27/2000 Document Revised: 02/24/2013 Document Reviewed: 12/29/2011 [facility] Patient Information 2014 [place]ExitCare, [place].

## 2014-03-07 NOTE — ED Notes (Signed)
MD at bedside. 

## 2014-03-08 ENCOUNTER — Encounter (HOSPITAL_COMMUNITY): Payer: Self-pay

## 2014-03-08 ENCOUNTER — Inpatient Hospital Stay (HOSPITAL_COMMUNITY)
Admission: AD | Admit: 2014-03-08 | Discharge: 2014-03-08 | Disposition: A | Discharge status: Home / Self Care | Payer: Medicaid Other | Source: Ambulatory Visit | Attending: Obstetrics & Gynecology | Admitting: Obstetrics & Gynecology

## 2014-03-08 DIAGNOSIS — N898 Other specified noninflammatory disorders of vagina: Secondary | ICD-10-CM | POA: Insufficient documentation

## 2014-03-08 DIAGNOSIS — B373 Candidiasis of vulva and vagina: Secondary | ICD-10-CM

## 2014-03-08 DIAGNOSIS — B3731 Acute candidiasis of vulva and vagina: Secondary | ICD-10-CM

## 2014-03-08 DIAGNOSIS — N949 Unspecified condition associated with female genital organs and menstrual cycle: Secondary | ICD-10-CM | POA: Insufficient documentation

## 2014-03-08 LAB — URINALYSIS, ROUTINE W REFLEX MICROSCOPIC
Glucose, UA: NEGATIVE mg/dL
KETONES UR: 40 mg/dL — AB
Leukocytes, UA: NEGATIVE
Nitrite: NEGATIVE
Protein, ur: NEGATIVE mg/dL
Specific Gravity, Urine: >1.03 — ABNORMAL HIGH (ref 1.005–1.030)
UROBILINOGEN UA: 0.2 mg/dL (ref 0.0–1.0)
pH: 6 (ref 5.0–8.0)

## 2014-03-08 LAB — WET PREP, GENITAL
Trich, Wet Prep: NONE SEEN
Yeast Wet Prep HPF POC: NONE SEEN

## 2014-03-08 LAB — URINE MICROSCOPIC-ADD ON

## 2014-03-08 LAB — POCT PREGNANCY, URINE: Preg Test, Ur: NEGATIVE

## 2014-03-08 MED ORDER — FLUCONAZOLE 150 MG PO TABS: 150.0000 mg | ORAL_TABLET | Freq: Once | ORAL | Status: DC

## 2014-03-08 MED ORDER — FLUCONAZOLE 150 MG PO TABS
150.0000 mg | ORAL_TABLET | Freq: Once | ORAL | Status: AC
  Administered 2014-03-08: 150 mg via ORAL
  Filled 2014-03-08: qty 1

## 2014-03-08 NOTE — Discharge Instructions (Signed)
Candidal Vulvovaginitis  Candidal vulvovaginitis is an infection of the vagina and vulva. The vulva is the skin around the opening of the vagina. This may cause itching and discomfort in and around the vagina.   HOME CARE  · Only take medicine as told by your doctor.  · Do not have sex (intercourse) until the infection is healed or as told by your doctor.  · Practice safe sex.  · Tell your sex partner about your infection.  · Do not douche or use tampons.  · Wear cotton underwear. Do not wear tight pants or panty hose.  · Eat yogurt. This may help treat and prevent yeast infections.  GET HELP RIGHT AWAY IF:   · You have a fever.  · Your problems get worse during treatment or do not get better in 3 days.  · You have discomfort, irritation, or itching in your vagina or vulva area.  · You have pain after sex.  · You start to get belly (abdominal) pain.  MAKE SURE YOU:  · Understand these instructions.  · Will watch your condition.  · Will get help right away if you are not doing well or get worse.  Document Released: 01/26/2009 Document Revised: 01/22/2012 Document Reviewed: 01/26/2009  ExitCare® Patient Information ©2014 [employer].

## 2014-03-08 NOTE — MAU Note (Signed)
Pt presents complaining of thick white discharge with some irritation starting yesterday. Denies vaginal bleeding.

## 2014-03-10 ENCOUNTER — Encounter: Payer: Self-pay | Admitting: Nurse Practitioner

## 2014-03-10 ENCOUNTER — Ambulatory Visit (INDEPENDENT_AMBULATORY_CARE_PROVIDER_SITE_OTHER): Payer: Medicaid Other | Admitting: Nurse Practitioner

## 2014-03-10 VITALS — BP 137/91 | HR 85 | Ht 67.0 in | Wt 148.0 lb

## 2014-03-10 DIAGNOSIS — F419 Anxiety disorder, unspecified: Secondary | ICD-10-CM

## 2014-03-10 DIAGNOSIS — G43909 Migraine, unspecified, not intractable, without status migrainosus: Secondary | ICD-10-CM

## 2014-03-10 DIAGNOSIS — F411 Generalized anxiety disorder: Secondary | ICD-10-CM

## 2014-03-10 LAB — GC/CHLAMYDIA PROBE AMP
CT PROBE, AMP APTIMA: NEGATIVE
GC PROBE AMP APTIMA: NEGATIVE

## 2014-03-10 MED ORDER — PROMETHAZINE HCL 25 MG PO TABS: 25.0000 mg | ORAL_TABLET | Freq: Four times a day (QID) | ORAL | Status: AC | PRN

## 2014-03-10 MED ORDER — SUMATRIPTAN SUCCINATE 6 MG/0.5ML ~~LOC~~ SOLN
6.0000 mg | Freq: Once | SUBCUTANEOUS | Status: AC
  Administered 2014-03-10: 6 mg via SUBCUTANEOUS

## 2014-03-10 MED ORDER — KETOROLAC TROMETHAMINE 60 MG/2ML IM SOLN
60.0000 mg | Freq: Once | INTRAMUSCULAR | Status: AC
Start: 2014-03-10 — End: 2014-03-10
  Administered 2014-03-10 (×2): 60 mg via INTRAMUSCULAR

## 2014-03-10 MED ORDER — SUMATRIPTAN SUCCINATE 100 MG PO TABS: 100.0000 mg | ORAL_TABLET | Freq: Once | ORAL | Status: AC | PRN

## 2014-03-10 MED ORDER — SERTRALINE HCL 50 MG PO TABS: 50.0000 mg | ORAL_TABLET | Freq: Every day | ORAL | Status: AC

## 2014-03-10 MED ORDER — IBUPROFEN 800 MG PO TABS: 800.0000 mg | ORAL_TABLET | Freq: Three times a day (TID) | ORAL | Status: AC | PRN

## 2014-03-10 NOTE — Progress Notes (Signed)
On today's date only one 60mg  Torodol injection was given, not two as it appears under documentation in the [facility]MAR.  This duplicate documentation was an error.

## 2014-03-10 NOTE — Progress Notes (Signed)
Diagnosis: Migraine without aura and anxiety  History: Nicole Olson Spaeth 23 y.o. [id number]. She called the ambulance this weekend for a panic attack and spent a fair amount of time at hospital with migraine and anxiety. She has had 3 major events happen in last few weeks including moving, new job and breaking up with boyfriend after he admitted to cheating. She had been given Zoloft in past but only took it for a few days because she does not like to take medications. This migraine start 3 weeks ago with all her stress. Normally she handles things well and uses Imitrex and Motrin for migraine.    Location: Bilateral temples  Number of Headache days/month: Severe:0 Moderate:30 Mild:0  No current outpatient prescriptions on file prior to visit.   No current facility-administered medications on file prior to visit.    Acute/ prevention: Zoloft, Imitrex, Phenerga, Zofran, Imitrex,   Past Medical History  Diagnosis Date  . Migraine   . Anxiety    Past Surgical History  Procedure Laterality Date  . No past surgeries     Family History  Problem Relation Age of Onset  . Breast cancer Mother    Social History:  reports that she has never smoked. She has never used smokeless tobacco. She reports that she uses illicit drugs (Marijuana). She reports that she does not drink alcohol. Allergies: No Known Allergies  Triggers: stress  Birth control: ?  ROS: positive for migraine, anxiety, panic attacks, SOB, chest pain, palpations, tingling in fingers and toes, nausea  Exam: Well developed, well nourished AA female who is quite anxious  General:NAD / did vomit after Imitrex injection HEENT:Negative Cardiac:RRR Lungs:Clear Neuro:Negative other than anxiety Skin:warm and dry  Impression:migraine - common  Anxiety  Plan: We gave her Toradol and Imitrex 6 mg injections in office. She became nauseated and vomited. After the migraine was  gone.  She agreed to retrial the Zoloft. She was taught breathing techniques and advised on ways to calm herself. She will use Imitrex/ phenergan/ Motrin for migraine. 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 RTC 2 weeks  Time Spent: 45 minutes

## 2014-03-10 NOTE — Patient Instructions (Signed)
Migraine Headache A migraine headache is an intense, throbbing pain on one or both sides of your head. A migraine can last for 30 minutes to several hours. CAUSES  The exact cause of a migraine headache is not always known. However, a migraine may be caused when nerves in the brain become irritated and release chemicals that cause inflammation. This causes pain. Certain things may also trigger migraines, such as:  Alcohol.  Smoking.  Stress.  Menstruation.  Aged cheeses.  Foods or drinks that contain nitrates, glutamate, aspartame, or tyramine.  Lack of sleep.  Chocolate.  Caffeine.  Hunger.  Physical exertion.  Fatigue.  Medicines used to treat chest pain (nitroglycerine), birth control pills, estrogen, and some blood pressure medicines. SIGNS AND SYMPTOMS  Pain on one or both sides of your head.  Pulsating or throbbing pain.  Severe pain that prevents daily activities.  Pain that is aggravated by any physical activity.  Nausea, vomiting, or both.  Dizziness.  Pain with exposure to bright lights, loud noises, or activity.  General sensitivity to bright lights, loud noises, or smells. Before you get a migraine, you may get warning signs that a migraine is coming (aura). An aura may include:  Seeing flashing lights.  Seeing bright spots, halos, or zig-zag lines.  Having tunnel vision or blurred vision.  Having feelings of numbness or tingling.  Having trouble talking.  Having muscle weakness. DIAGNOSIS  A migraine headache is often diagnosed based on:  Symptoms.  Physical exam.  A CT scan or MRI of your head. These imaging tests cannot diagnose migraines, but they can help rule out other causes of headaches. TREATMENT Medicines may be given for pain and nausea. Medicines can also be given to help prevent recurrent migraines.  HOME CARE INSTRUCTIONS  Only take over-the-counter or prescription medicines for pain or discomfort as directed by your  health care provider. The use of long-term narcotics is not recommended.  Lie down in a dark, quiet room when you have a migraine.  Keep a journal to find out what may trigger your migraine headaches. For example, write down:  What you eat and drink.  How much sleep you get.  Any change to your diet or medicines.  Limit alcohol consumption.  Quit smoking if you smoke.  Get 7 9 hours of sleep, or as recommended by your health care provider.  Limit stress.  Keep lights dim if bright lights bother you and make your migraines worse. SEEK IMMEDIATE MEDICAL CARE IF:   Your migraine becomes severe.  You have a fever.  You have a stiff neck.  You have vision loss.  You have muscular weakness or loss of muscle control.  You start losing your balance or have trouble walking.  You feel faint or pass out.  You have severe symptoms that are different from your first symptoms. MAKE SURE YOU:   Understand these instructions.  Will watch your condition.  Will get help right away if you are not doing well or get worse. Document Released: 10/30/2005 Document Revised: 08/20/2013 Document Reviewed: 07/07/2013 ExitCare Patient Information 2014 [employer].  

## 2014-03-24 ENCOUNTER — Encounter: Payer: Medicaid Other | Admitting: Nurse Practitioner

## 2014-04-07 ENCOUNTER — Encounter: Payer: Medicaid Other | Admitting: Nurse Practitioner

## 2014-04-18 ENCOUNTER — Encounter (HOSPITAL_COMMUNITY): Payer: Self-pay | Admitting: Emergency Medicine

## 2014-04-18 ENCOUNTER — Emergency Department (HOSPITAL_COMMUNITY)
Admission: EM | Admit: 2014-04-18 | Discharge: 2014-04-18 | Disposition: A | Discharge status: Home / Self Care | Payer: Medicaid Other | Attending: Emergency Medicine | Admitting: Emergency Medicine

## 2014-04-18 DIAGNOSIS — N760 Acute vaginitis: Secondary | ICD-10-CM | POA: Insufficient documentation

## 2014-04-18 DIAGNOSIS — B9689 Other specified bacterial agents as the cause of diseases classified elsewhere: Secondary | ICD-10-CM

## 2014-04-18 DIAGNOSIS — Z792 Long term (current) use of antibiotics: Secondary | ICD-10-CM | POA: Insufficient documentation

## 2014-04-18 DIAGNOSIS — G43909 Migraine, unspecified, not intractable, without status migrainosus: Secondary | ICD-10-CM | POA: Insufficient documentation

## 2014-04-18 DIAGNOSIS — F411 Generalized anxiety disorder: Secondary | ICD-10-CM | POA: Insufficient documentation

## 2014-04-18 DIAGNOSIS — T50905A Adverse effect of unspecified drugs, medicaments and biological substances, initial encounter: Secondary | ICD-10-CM

## 2014-04-18 DIAGNOSIS — Z79899 Other long term (current) drug therapy: Secondary | ICD-10-CM | POA: Insufficient documentation

## 2014-04-18 DIAGNOSIS — L509 Urticaria, unspecified: Secondary | ICD-10-CM | POA: Insufficient documentation

## 2014-04-18 DIAGNOSIS — T3695XA Adverse effect of unspecified systemic antibiotic, initial encounter: Secondary | ICD-10-CM | POA: Insufficient documentation

## 2014-04-18 DIAGNOSIS — N898 Other specified noninflammatory disorders of vagina: Secondary | ICD-10-CM | POA: Insufficient documentation

## 2014-04-18 MED ORDER — METRONIDAZOLE 500 MG PO TABS: 500.0000 mg | ORAL_TABLET | Freq: Two times a day (BID) | ORAL | Status: AC

## 2014-04-18 MED ORDER — DEXAMETHASONE SODIUM PHOSPHATE 10 MG/ML IJ SOLN
10.0000 mg | Freq: Once | INTRAMUSCULAR | Status: AC
  Administered 2014-04-18: 10 mg via INTRAMUSCULAR
  Filled 2014-04-18: qty 1

## 2014-04-18 MED ORDER — DIPHENHYDRAMINE HCL 25 MG PO CAPS
25.0000 mg | ORAL_CAPSULE | Freq: Once | ORAL | Status: DC
  Filled 2014-04-18: qty 1

## 2014-04-18 MED ORDER — HYDROXYZINE HCL 25 MG PO TABS: 25.0000 mg | ORAL_TABLET | Freq: Four times a day (QID) | ORAL | Status: AC

## 2014-04-18 NOTE — ED Notes (Signed)
Pt started Bactrim DS BID 04-11-14 for UTI.   Pt took 2 doses yesterday, none today.  Onset yesterday hives on arms, legs and back.  Itching.  Pt last took Benadryl 25 mg x 2 @ 9am this am with no relief.  Pt scratching self at this time.  No tongue, throat swelling.  No resp distress.

## 2014-04-18 NOTE — Discharge Instructions (Signed)
Drug Allergy Allergic reactions to medicines are common. Some allergic reactions are mild. A delayed type of drug allergy that occurs 1 week or more after exposure to a medicine or vaccine is called serum sickness. A life-threatening, sudden (acute) allergic reaction that involves the whole body is called anaphylaxis. CAUSES  "True" drug allergies occur when there is an allergic reaction to a medicine. This is caused by overactivity of the immune system. First, the body becomes sensitized. The immune system is triggered by your first exposure to the medicine. Following this first exposure, future exposure to the same medicine may be life-threatening. Almost any medicine can cause an allergic reaction. Common ones are:  Penicillin.  Sulfonamides (sulfa drugs).  Local anesthetics.  X-ray dyes that contain iodine. SYMPTOMS  Common symptoms of a minor allergic reaction are:  Swelling around the mouth.  An itchy red rash or hives.  Vomiting or diarrhea. Anaphylaxis can cause swelling of the mouth and throat. This makes it difficult to breathe and swallow. Severe reactions can be fatal within seconds, even after exposure to only a trace amount of the drug that causes the reaction. HOME CARE INSTRUCTIONS   If you are unsure of what caused your reaction, keep a diary of foods and medicines used. Include the symptoms that followed. Avoid anything that causes reactions.  You may want to follow up with an allergy specialist after the reaction has cleared in order to be tested to confirm the allergy. It is important to confirm that your reaction is an allergy, not just a side effect to the medicine. If you have a true allergy to a medicine, this may prevent that medicine and related medicines from being given to you when you are very ill.  If you have hives or a rash:  Take medicines as directed by your caregiver.  You may use an over-the-counter antihistamine (diphenhydramine) as  needed.  Apply cold compresses to the skin or take baths in cool water. Avoid hot baths or showers.  If you are severely allergic:  Continuous observation after a severe reaction may be needed. Hospitalization is often required.  Wear a medical alert bracelet or necklace stating your allergy.  You and your family must learn how to use an anaphylaxis kit or give an epinephrine injection to temporarily treat an emergency allergic reaction. If you have had a severe reaction, always carry your epinephrine injection or anaphylaxis kit with you. This can be lifesaving if you have a severe reaction.  Do not drive or perform tasks after treatment until the medicines used to treat your reaction have worn off, or until your caregiver says it is okay. SEEK MEDICAL CARE IF:   You think you had an allergic reaction. Symptoms usually start within 30 minutes after exposure.  Symptoms are getting worse rather than better.  You develop new symptoms.  The symptoms that brought you to your caregiver return. SEEK IMMEDIATE MEDICAL CARE IF:   You have swelling of the mouth, difficulty breathing, or wheezing.  You have a tight feeling in your chest or throat.  You develop hives, swelling, or itching all over your body.  You develop severe vomiting or diarrhea.  You feel faint or pass out. This is an emergency. Use your epinephrine injection or anaphylaxis kit as you have been instructed. Call for emergency medical help. Even if you improve after the injection, you need to be examined at a hospital emergency department. MAKE SURE YOU:   Understand these instructions.  Will watch  your condition.  Will get help right away if you are not doing well or get worse. Document Released: 10/30/2005 Document Revised: 01/22/2012 Document Reviewed: 04/05/2011 [facility] Patient Information 2014 [place], [place].  Hives Hives are itchy, red, swollen areas of the skin. They can vary in size and location on your  body. Hives can come and go for hours or several days (acute hives) or for several weeks (chronic hives). Hives do not spread from person to person (noncontagious). They may get worse with scratching, exercise, and emotional stress. CAUSES   Allergic reaction to food, additives, or drugs.  Infections, including the common cold.  Illness, such as vasculitis, lupus, or thyroid disease.  Exposure to sunlight, heat, or cold.  Exercise.  Stress.  Contact with chemicals. SYMPTOMS   Red or white swollen patches on the skin. The patches may change size, shape, and location quickly and repeatedly.  Itching.  Swelling of the hands, feet, and face. This may occur if hives develop deeper in the skin. DIAGNOSIS  Your caregiver can usually tell what is wrong by performing a physical exam. Skin or blood tests may also be done to determine the cause of your hives. In some cases, the cause cannot be determined. TREATMENT  Mild cases usually get better with medicines such as antihistamines. Severe cases may require an emergency epinephrine injection. If the cause of your hives is known, treatment includes avoiding that trigger.  HOME CARE INSTRUCTIONS   Avoid causes that trigger your hives.  Take antihistamines as directed by your caregiver to reduce the severity of your hives. Non-sedating or low-sedating antihistamines are usually recommended. Do not drive while taking an antihistamine.  Take any other medicines prescribed for itching as directed by your caregiver.  Wear loose-fitting clothing.  Keep all follow-up appointments as directed by your caregiver. SEEK MEDICAL CARE IF:   You have persistent or severe itching that is not relieved with medicine.  You have painful or swollen joints. SEEK IMMEDIATE MEDICAL CARE IF:   You have a fever.  Your tongue or lips are swollen.  You have trouble breathing or swallowing.  You feel tightness in the throat or chest.  You have abdominal  pain. These problems may be the first sign of a life-threatening allergic reaction. Call your local emergency services (911 in [place] MAKE SURE YOU:   Understand these instructions.  Will watch your condition.  Will get help right away if you are not doing well or get worse. Document Released: 10/30/2005 Document Revised: 04/30/2012 Document Reviewed: 01/23/2012 [facility] Patient Information 2014 [place].

## 2014-04-18 NOTE — ED Notes (Signed)
Pt. reports itchy hives at arms , back and legs onset yesterday , respirations unlabored / airway intact.

## 2014-04-18 NOTE — ED Provider Notes (Signed)
CSN: [id number]     Arrival date & time 04/18/14  2023 History  This chart was scribed for non-physician practitioner Arthor Captain, PA-C  working with No att. providers found by Elveria Rising, ED Scribe. This patient was seen in room TR07C/TR07C and the patient's care was started at 10:27 PM.   Chief Complaint  Patient presents with  . Urticaria     The history is provided by the patient. No language interpreter was used.   HPI Comments: [place] Q Fross is a 23 y.o. female who presents to the Emergency Department complaining of itchy hives on arms, back and legs, onset yesterday. Patient prescribed Bactrim; recently diagnosed with UTI. Patient given 6 day series of antibiotics; she has two pills left. The hives appear to be an allergic reaction to the antibiotic. Patient denies wheezing or heavy felling sensation in her chest. Patient denies previous history hives.   Patient reports having a pelvic exam done 4 days ago when in [place]. She reports thin, mucousy vaginal discharge.   Past Medical History  Diagnosis Date  . Migraine   . Anxiety    Past Surgical History  Procedure Laterality Date  . No past surgeries     Family History  Problem Relation Age of Onset  . Breast cancer Mother    History  Substance Use Topics  . Smoking status: Never Smoker   . Smokeless tobacco: Never Used  . Alcohol Use: No   OB History   Grav Para Term Preterm Abortions TAB SAB Ect Mult Living   1 1 1       1      Review of Systems  Constitutional: Negative for fever.  Respiratory: Negative for shortness of breath and wheezing.   Cardiovascular: Negative for chest pain.  Genitourinary: Positive for vaginal discharge.  Skin: Positive for rash.  Neurological: Negative for headaches.  Psychiatric/Behavioral: Negative for confusion.      Allergies  Septra  Home Medications   Prior to Admission medications   Medication Sig Start Date End Date Taking? Authorizing Provider  ibuprofen  (ADVIL,MOTRIN) 800 MG tablet Take 1 tablet (800 mg total) by mouth every 8 (eight) hours as needed. 03/10/14  Yes Delbert Phenix, NP  promethazine (PHENERGAN) 25 MG tablet Take 1 tablet (25 mg total) by mouth every 6 (six) hours as needed for nausea or vomiting. 03/10/14  Yes Delbert Phenix, NP  sertraline (ZOLOFT) 50 MG tablet Take 1 tablet (50 mg total) by mouth daily. 03/10/14  Yes Delbert Phenix, NP  sulfamethoxazole-trimethoprim (BACTRIM DS) 800-160 MG per tablet Take 1 tablet by mouth 2 (two) times daily.   Yes Historical Provider, MD  SUMAtriptan (IMITREX) 100 MG tablet Take 1 tablet (100 mg total) by mouth once as needed for migraine. May repeat in 2 hours if headache persists or recurs. 03/10/14 03/10/15 Yes Delbert Phenix, NP   Triage Vitals: BP 110/76  Pulse 82  Temp(Src) 98.4 F (36.9 C) (Oral)  Resp 20  Ht 5\' 7"  (1.702 m)  Wt 145 lb (65.772 kg)  BMI 22.71 kg/m2  SpO2 100% Physical Exam  Nursing note and vitals reviewed. Constitutional: She is oriented to person, place, and time. She appears well-developed and well-nourished. No distress.  HENT:  Head: Normocephalic and atraumatic.  Eyes: EOM are normal.  Neck: Neck supple. No tracheal deviation present.  Cardiovascular: Normal rate.   Pulmonary/Chest: Effort normal. No respiratory distress.  Musculoskeletal: Normal range of motion.  Neurological: She is alert and oriented  to person, place, and time.  Skin: Skin is warm and dry. Rash noted.  Multiple wheels all over the arms, legs, trunk buttocks no oral involvement. No lip or tongue swelling. Airway is patent.    Psychiatric: She has a normal mood and affect. Her behavior is normal.    ED Course  Procedures (including critical care time) DIAGNOSTIC STUDIES: Oxygen Saturation is 100% on room air, normal by my interpretation.    COORDINATION OF CARE: 10:37 PM- Discussed treatment plan with patient at bedside and patient agreed to plan.     Labs Review Labs  Reviewed - No data to display  Imaging Review No results found.   EKG Interpretation None      MDM   Final diagnoses:  Hives  Medication reaction  BV (bacterial vaginosis)   Medications  dexamethasone (DECADRON) injection 10 mg (10 mg Intramuscular Given 04/18/14 2313)    Patient hives treated in the ED. Decadron IM here. Discontiue bactrim.   D/c with atarx. Patient has recurrent BV. And requests rx. F/u with ob/gyn if sxs do not resolve. D/c with atat  I personally performed the services described in this documentation, which was scribed in my presence. The recorded information has been reviewed and is accurate.    Arthor CaptainAbigail Marselino Slayton, PA-C 04/25/14 1342

## 2014-04-21 ENCOUNTER — Encounter: Payer: Medicaid Other | Admitting: Nurse Practitioner

## 2014-04-25 NOTE — ED Provider Notes (Signed)
Medical screening examination/treatment/procedure(s) were performed by non-physician practitioner and as supervising physician I was immediately available for consultation/collaboration.  Fatmata Legere T Xianna Siverling, MD 04/25/14 1508 

## 2014-05-12 ENCOUNTER — Encounter: Payer: Medicaid Other | Admitting: Nurse Practitioner

## 2014-09-14 ENCOUNTER — Encounter (HOSPITAL_COMMUNITY): Payer: Self-pay | Admitting: Emergency Medicine

## 2019-08-09 ENCOUNTER — Other Ambulatory Visit: Payer: Self-pay

## 2019-08-09 ENCOUNTER — Emergency Department (HOSPITAL_COMMUNITY)
Admission: EM | Admit: 2019-08-09 | Discharge: 2019-08-09 | Disposition: A | Discharge status: Home / Self Care | Payer: Medicaid - Out of State | Attending: Emergency Medicine | Admitting: Emergency Medicine

## 2019-08-09 DIAGNOSIS — Y939 Activity, unspecified: Secondary | ICD-10-CM | POA: Diagnosis not present

## 2019-08-09 DIAGNOSIS — T192XXA Foreign body in vulva and vagina, initial encounter: Secondary | ICD-10-CM | POA: Diagnosis present

## 2019-08-09 DIAGNOSIS — F1210 Cannabis abuse, uncomplicated: Secondary | ICD-10-CM | POA: Insufficient documentation

## 2019-08-09 DIAGNOSIS — Y929 Unspecified place or not applicable: Secondary | ICD-10-CM | POA: Insufficient documentation

## 2019-08-09 DIAGNOSIS — Z711 Person with feared health complaint in whom no diagnosis is made: Secondary | ICD-10-CM | POA: Diagnosis not present

## 2019-08-09 DIAGNOSIS — Y999 Unspecified external cause status: Secondary | ICD-10-CM | POA: Diagnosis not present

## 2019-08-09 DIAGNOSIS — Y33XXXA Other specified events, undetermined intent, initial encounter: Secondary | ICD-10-CM | POA: Insufficient documentation

## 2019-08-09 NOTE — ED Notes (Signed)
Unable to remove tampon that has been in place 2 1/2 hours.  Pt has long fingernails and was unable to remove it.

## 2019-08-09 NOTE — ED Provider Notes (Signed)
[facility] Provider Note   CSN: [id number] Arrival date & time: 08/09/19  [place]     History   Chief Complaint No chief complaint on file.   HPI [facility] Olson is a 28 y.o. female who presents with possible fb in vagina. PMH significant for anxiety and migraines. She states that she inserted a tampon her in vagina about 2 hours ago. Since then she has been unable to feel Olson strings. She is on Depo for birth control and spots sometimes. She denies any pain or discharge.     HPI  Past Medical History:  Diagnosis Date  . Anxiety   . Migraine     Patient Active Problem List   Diagnosis Date Noted  . Migraine 03/10/2014  . Anxiety 03/10/2014  . Dizziness 03/07/2014  . OXBDZHGD(924.2) 01/30/2014    Past Surgical History:  Procedure Laterality Date  . NO PAST SURGERIES       OB History    Gravida  1   Para  1   Term  1   Preterm      AB      Living  1     SAB      TAB      Ectopic      Multiple      Live Births  1            Home Medications    Prior to Admission medications   Medication Sig Start Date End Date Taking? Authorizing Provider  hydrOXYzine (ATARAX/VISTARIL) 25 MG tablet Take 1 tablet (25 mg total) by mouth every 6 (six) hours. 04/18/14   Margarita Mail, PA-C  ibuprofen (ADVIL,MOTRIN) 800 MG tablet Take 1 tablet (800 mg total) by mouth every 8 (eight) hours as needed. 03/10/14   Barefoot, Connye Burkitt, NP  metroNIDAZOLE (FLAGYL) 500 MG tablet Take 1 tablet (500 mg total) by mouth 2 (two) times daily. One po bid x 7 days 04/18/14   Margarita Mail, PA-C  promethazine (PHENERGAN) 25 MG tablet Take 1 tablet (25 mg total) by mouth every 6 (six) hours as needed for nausea or vomiting. 03/10/14   Barefoot, Connye Burkitt, NP  sertraline (ZOLOFT) 50 MG tablet Take 1 tablet (50 mg total) by mouth daily. 03/10/14   Barefoot, Connye Burkitt, NP  sulfamethoxazole-trimethoprim (BACTRIM DS) 800-160 MG per tablet Take 1 tablet by  mouth 2 (two) times daily.    [provider]    Family History Family History  Problem Relation Age of Onset  . Breast cancer Mother     Social History Social History   Tobacco Use  . Smoking status: Never Smoker  . Smokeless tobacco: Never Used  Substance Use Topics  . Alcohol use: No  . Drug use: Yes    Types: Marijuana    Comment: "6 days ago"     Allergies   Septra [sulfamethoxazole-trimethoprim]   Review of Systems Review of Systems  Constitutional: Negative for fever.  Genitourinary: Negative for pelvic pain, vaginal bleeding and vaginal discharge.       +fb in vagina     Physical Exam Updated Vital Signs BP 111/70 (BP Location: Left Arm)   Pulse 84   Temp 98.4 F (36.9 C) (Oral)   Resp 18   Ht 5\' 9"  (1.753 m)   Wt 127 kg   SpO2 100%   BMI 41.35 kg/m   Physical Exam Vitals signs and nursing note reviewed.  Constitutional:  General: She is not in acute distress.    Appearance: Normal appearance. She is well-developed. She is not ill-appearing.  HENT:     Head: Normocephalic and atraumatic.  Eyes:     General: No scleral icterus.       Right eye: No discharge.        Left eye: No discharge.     Conjunctiva/sclera: Conjunctivae normal.     Pupils: Pupils are equal, round, and reactive to light.  Neck:     Musculoskeletal: Normal range of motion.  Cardiovascular:     Rate and Rhythm: Normal rate and regular rhythm.  Pulmonary:     Effort: Pulmonary effort is normal. No respiratory distress.     Breath sounds: Normal breath sounds.  Abdominal:     General: There is no distension.     Palpations: Abdomen is soft.     Tenderness: There is no abdominal tenderness.  Genitourinary:    Comments: Pelvic: No inguinal lymphadenopathy or inguinal hernia noted. Normal external genitalia. No pain with speculum insertion. Closed cervical os with normal appearance - no rash or lesions. There is some bleeding in vaginal vault. Tampon is not  seen or felt. Chaperone present during exam.  Skin:    General: Skin is warm and dry.  Neurological:     Mental Status: She is alert and oriented to person, place, and time.  Psychiatric:        Behavior: Behavior normal.      ED Treatments / Results  Labs (all labs ordered are listed, but only abnormal results are displayed) Labs Reviewed - No data to display  EKG None  Radiology No results found.  Procedures Procedures (including critical care time)  Medications Ordered in ED Medications - No data to display   Initial Impression / Assessment and Plan / ED Course  I have reviewed Olson triage vital signs and Olson nursing notes.  Pertinent labs & imaging results that were available during my care of Olson patient were reviewed by me and considered in my medical decision making (see chart for details).  28 year old female presents with possible foreign body in Olson vaginal area after inserting a tampon several hours ago.  She is unable to feel Olson string.  Pelvic exam was performed and a tampon was not visualized.  Patient was given reassurance and discharged  Final Clinical Impressions(s) / ED Diagnoses   Final diagnoses:  Physically well but worried    ED Discharge Orders    None       Beryle Quant 08/09/19 2102    Linwood Dibbles, MD 08/10/19 [phone]
